# Patient Record
Sex: Male | Born: 1940 | Race: White | Hispanic: No | Marital: Married | Smoking: Former smoker
Health system: Southern US, Community
[De-identification: ages and names within clinical notes are randomized; demographics above are authoritative.]

## PROBLEM LIST (undated history)

## (undated) DIAGNOSIS — I700 Atherosclerosis of aorta: Secondary | ICD-10-CM

## (undated) DIAGNOSIS — I714 Abdominal aortic aneurysm, without rupture, unspecified: Secondary | ICD-10-CM

## (undated) DIAGNOSIS — I724 Aneurysm of artery of lower extremity: Secondary | ICD-10-CM

## (undated) DIAGNOSIS — E785 Hyperlipidemia, unspecified: Secondary | ICD-10-CM

## (undated) DIAGNOSIS — C801 Malignant (primary) neoplasm, unspecified: Secondary | ICD-10-CM

## (undated) DIAGNOSIS — J449 Chronic obstructive pulmonary disease, unspecified: Secondary | ICD-10-CM

## (undated) DIAGNOSIS — I2510 Atherosclerotic heart disease of native coronary artery without angina pectoris: Secondary | ICD-10-CM

## (undated) DIAGNOSIS — I10 Essential (primary) hypertension: Secondary | ICD-10-CM

## (undated) HISTORY — DX: Abdominal aortic aneurysm, without rupture, unspecified: I71.40

## (undated) HISTORY — DX: Hyperlipidemia, unspecified: E78.5

## (undated) HISTORY — DX: Abdominal aortic aneurysm, without rupture: I71.4

## (undated) HISTORY — PX: ABDOMINAL AORTIC ANEURYSM REPAIR: SUR1152

## (undated) HISTORY — DX: Essential (primary) hypertension: I10

## (undated) HISTORY — DX: Atherosclerosis of aorta: I70.0

## (undated) HISTORY — DX: Chronic obstructive pulmonary disease, unspecified: J44.9

## (undated) HISTORY — DX: Malignant (primary) neoplasm, unspecified: C80.1

## (undated) HISTORY — DX: Aneurysm of artery of lower extremity: I72.4

## (undated) HISTORY — PX: FEMORAL ARTERY ANEURYSM REPAIR: SUR1157

## (undated) HISTORY — DX: Atherosclerotic heart disease of native coronary artery without angina pectoris: I25.10

---

## 2008-01-21 ENCOUNTER — Ambulatory Visit: Payer: Self-pay | Admitting: Cardiology

## 2008-01-24 ENCOUNTER — Ambulatory Visit: Payer: Self-pay

## 2008-02-14 ENCOUNTER — Ambulatory Visit: Payer: Self-pay | Admitting: Cardiology

## 2008-02-19 ENCOUNTER — Ambulatory Visit: Payer: Self-pay | Admitting: Cardiology

## 2008-02-19 LAB — CONVERTED CEMR LAB
BUN: 21 mg/dL (ref 6–23)
Creatinine, Ser: 1.24 mg/dL (ref 0.40–1.50)
Glucose, Bld: 94 mg/dL (ref 70–99)
INR: 1.1 (ref 0.0–1.5)
Potassium: 4.5 meq/L (ref 3.5–5.3)
Prothrombin Time: 14.3 s (ref 11.6–15.2)

## 2008-02-21 ENCOUNTER — Ambulatory Visit: Payer: Self-pay | Admitting: Cardiology

## 2008-02-21 LAB — CONVERTED CEMR LAB
Basophils Relative: 1 % (ref 0–1)
Eosinophils Absolute: 0.4 10*3/uL (ref 0.0–0.7)
Lymphs Abs: 2.7 10*3/uL (ref 0.7–4.0)
MCHC: 34.2 g/dL (ref 30.0–36.0)
MCV: 91.3 fL (ref 78.0–100.0)
Neutro Abs: 3.8 10*3/uL (ref 1.7–7.7)
Neutrophils Relative %: 46 % (ref 43–77)
Platelets: 195 10*3/uL (ref 150–400)
WBC: 8.2 10*3/uL (ref 4.0–10.5)

## 2008-02-25 ENCOUNTER — Ambulatory Visit: Payer: Self-pay | Admitting: Internal Medicine

## 2008-02-25 ENCOUNTER — Inpatient Hospital Stay (HOSPITAL_BASED_OUTPATIENT_CLINIC_OR_DEPARTMENT_OTHER): Admission: RE | Admit: 2008-02-25 | Discharge: 2008-02-25 | Payer: Self-pay | Admitting: Internal Medicine

## 2008-03-12 ENCOUNTER — Ambulatory Visit: Payer: Self-pay | Admitting: Cardiology

## 2011-02-14 NOTE — Assessment & Plan Note (Signed)
[facility] OFFICE NOTE   NAME:Friddle, AKEEL REFFNER                       MRN:          [id number]  DATE:02/14/2008                            DOB:          [birth year]    CHIEF COMPLAINT:  Shortness of breath with exertion.   HISTORY OF PRESENT ILLNESS:  Mr. Kayo Zion is a very pleasant 70-year-  old retired white male who I saw initially on January 21, 2008 with the  above chief complaint.  He was a heavy smoker, two to three packs per  day, until he quit in April 2008.  He has a history of hypertension and  is overweight.   He had been profoundly short of breath.  Has gotten progressively worse,  particularly over the past year.  He has gotten to the point he can  barely play golf.   I arranged for him to have a stress Myoview to reproduce his symptoms  and to see what his exercise capacity was.   Unfortunately, he only exercised for 3 minutes reaching a met level of  only 4.8.  His heart rate jumped up from a baseline of about 90-100  beats per minute, sinus rhythm to 140 which is 90% of predicted maximum  heart rate.  There were nonspecific ST-segment changes with occasional  PVCs.  Stress Myoview showed a question of some inferior wall ischemia  which was a borderline call.  His EF was 68%.  He had normal  contractility thickening in all areas of myocardium.   He is now being set up for right and left heart cath to evaluate for  potential coronary disease as well as pulmonary hypertension.   PAST MEDICAL HISTORY:  1. He is currently on Terazosin 5 mg a day for blood pressure.  His      blood pressure log at home shows excellent control his blood      pressure.  2. He is also on aspirin 81 mg daily.   PAST SURGICAL HISTORY:  Prostate cancer surgery 1998 at [facility].   FAMILY HISTORY:  Unremarkable and noncontributory.   SOCIAL HISTORY:  He retired as of December 2003.  He is married and has  three  children.  His wife came to the office today and she is  delightful.   REVIEW OF SYSTEMS:  He has a history of seasonal allergies, chronic lung  disease, fatigue and history of prostate CA.  Otherwise, his review of  systems are negative.   STUDIES:  He had a chest x-ray recently that I am told was normal.  Actually, I just got a copy of it, and it shows moderate interstitial  density in the mid and lower lungs as well as with hyperinflation.  He  has findings consistent with COPD with fibrosis.  Date of the x-ray was  January 14, 2008.   PHYSICAL EXAMINATION:  VITAL SIGNS:  blood pressure was 132/90.  Pressures at home are much better.  His pulse is 87 and regular.  His  EKG shows sinus rhythm with a right bundle left anterior fascicular  block.  HEENT:  Normocephalic, atraumatic.  PERRL.  Extraocular movements  intact.  Sclerae are clear.  Face symmetry is normal.  Dentition  satisfactory.  He is 6 feet tall, weighs 245.  SKIN:  Warm and dry.  NECK:  Supple.  Carotid upstrokes equal bilaterally without bruits, no  JVD.  Thyroid is not enlarged.  Trachea is midline.  LUNGS:  Clear with decreased breath sounds throughout.  HEART:  Reveals a nondisplaced PMI.  There is an S4.  There is no  gallop.  S2 splits physiologically.  It was difficult to appreciate a  right ventricular lift.  ABDOMEN:  Slightly protuberant, good bowel sounds.  No midline bruit.  No hepatomegaly.  EXTREMITIES:  No cyanosis, clubbing or edema.  Pulses are intact.  NEUROLOGICAL:  Exam is intact.  SKIN:  Unremarkable.   ASSESSMENT:  Severe dyspnea on exertion with a very limited exercise  capacity.  We need to rule out potential coronary ischemia with a  borderline called inferior wall ischemia on a stress Myoview.  We also  need to evaluate his right-sided pressures to rule out secondary  pulmonary hypertension from his COPD.  I suspect this is the real  problem.  Also physical deconditioning and his weight is  an issue.  His  blood pressure is under excellent control.   RECOMMENDATIONS:  Outpatient catheterization right/left coronary  arteries.  Risks and potential benefits discussed with the patient.  He  has no dye allergy.  Will obtain precath labs.  He and his wife agree to  proceed.     Thomas C. Verl Blalock, MD, [facility]  Electronically Signed    TCW/MedQ  DD: 02/14/2008  DT: 02/14/2008  Job #: [id number]   cc:   Golden Pop, MD

## 2011-02-14 NOTE — Assessment & Plan Note (Signed)
[facility] OFFICE NOTE   NAME:Charles Barton                       MRN:          [id number]  DATE:01/21/2008                            DOB:          1941/09/08    REFERRING PHYSICIAN:  Golden Pop, MD   I was asked by Dr. Golden Pop to evaluate Charles Barton and consult on  his dyspnea on exertion.   HISTORY OF PRESENT ILLNESS:  Charles Barton is a very pleasant 70 year old  retired married white male who comes with his wife today for the above  complaint.  Last spring while playing golf, he noticed he was a little  more short of breath.  The same thing happened this fall.  This spring,  getting out in his yard has gotten a lot worse.  He also notes that when  he bends over to do mulch, that he gets lightheaded and dizzy.  He has  not fainted.  He denies any true angina.   He has had no orthopnea, PND or increased peripheral edema.   His cardiac risk factors are pertinent for age, sex, obesity, history of  remote tobacco of 43 years sometimes up as much as 2-3 packs per day  which he quit in April 2008, and hypertension.  His lipid profile is  profoundly good on no medication with a total cholesterol of 145, LDL of  65.  His HDL is low, and his triglycerides are up at 241.   His past medical history in addition to the above; he does not drink.  He does not use any recreational products.  He tries walk 1-2 miles 2-3  times a week.  He has no known drug allergies.   CURRENT MEDICATIONS:  1. Terazosin which he had been on for a number of years 5 mg daily.  2. Aspirin 81 mg daily.  3. Dr. Jeananne Rama tried to switch his Terazosin over to what sounds like      benazepril HCTZ, but the patient did not really want to do that.   PAST SURGICAL HISTORY:  He has had prostate cancer surgery in 1998 at  [facility]:  Unremarkable and noncontributory.   SOCIAL HISTORY:  He is retired as of December  2003.  He is married and  has 3 children.   REVIEW OF SYSTEMS:  He has history of seasonal allergies, chronic lung  disease, fatigue, and a history of cancer.  Otherwise, his review of  systems are negative.   He had a chest x-ray recently that was apparently okay.  In addition, he  has had blood work including a hemoglobin which was 16.2.  The rest of  his blood work was really unremarkable.   EXAM TODAY:  He is 6 feet tall, weighs 245.  Blood pressure is 146/90.  His pulse is 92 and regular.  HEENT:  Normocephalic, atraumatic.  PERRLA.  Extraocular is intact.  Sclerae are clear.  Facial symmetry is normal.  NECK:  Supple  Carotid upstrokes are equal bilaterally without bruits,  no JVD.  Thyroid is not  enlarged.  Trachea is midline.  LUNGS:  Clear.  HEART:  Reveals a nondisplaced PMI.  He has an S4.  There is no other  gallop.  S2 splits physiologically.  ABDOMEN:  Slightly protuberant with good bowel sounds.  No midline  bruit.  No hepatomegaly.  EXTREMITIES:  No cyanosis, clubbing, or edema.  Pulses are intact.  NEURO:  Intact.  SKIN:  Unremarkable.   EKG shows sinus rhythm, right bundle branch block, left anterior  fascicular block.  Apparently he has had some changes in the past.  I do  not have an old EKG.   ASSESSMENT:  Dyspnea on exertion.  As I explained to he and his wife  today, this is multifactorial, including the fact that he is overweight,  is relatively sedentary, has hypertension which is probably not well  controlled, and has a long history of smoking and certainly has some  chronic obstructive lung disease.  We need to rule out obstructive  coronary disease, as I explained him his wife at length today.   RECOMMENDATIONS:  1. Excise rest stress Myoview.  We will hopefully reproduce his      symptoms and see if he has any myocardial ischemia.  Also be able      to evaluate his left ventricular function.  2. Twenty-five pounds of weight loss over the next 6-8  months.  3. Consider changing his Terazosin to what Dr. Jeananne Rama had suggested.      I told him he is probably going to need at least a combination      medication or even three class of medicines to really control his      blood pressure.  Losing weight will also decrease this      substantially.  4. Increase activity once we clear his coronaries.   I will get him back to talk about all this once we get his stress test  results.     Thomas C. Verl Blalock, MD, [facility]  Electronically Signed    TCW/MedQ  DD: 01/21/2008  DT: 01/21/2008  Job #: [id number]   cc:   [address], [place], [place] Dr. Golden Pop, [facility]

## 2011-02-14 NOTE — Cardiovascular Report (Signed)
Charles Barton, Charles Barton                ACCOUNT NO.:  [id number]   MEDICAL RECORD NO.:  [id number]          PATIENT TYPE:  OIB   LOCATION:  1962                         FACILITY:  [facility]   PHYSICIAN:  Shaune Pascal. Bensimhon, MDDATE OF BIRTH:  November 21, 1940   DATE OF PROCEDURE:  02/25/2008  DATE OF DISCHARGE:  02/25/2008                            CARDIAC CATHETERIZATION   IDENTIFICATION:  Charles Barton is a very pleasant 70 year old former [employer] with a history of hypertension and tobacco use.  For the  past several months, he has had progressive dyspnea on exertion.  He has  not had any chest pain.  He saw Dr. Verl Blalock and got a stress Myoview which  showed poor exercise tolerance with brisk heart rate response.  There  were nonspecific ST changes and a question of some inferior ischemia and  EF of 68%.  He also had PFTs which is showed early COPD.  He is referred  to the outpatient lab for right and left heart catheterization.   PROCEDURES PERFORMED:  1. Right heart catheterization.  2. Selective coronary angiography.  3. Left heart catheterization.  4. Abdominal angiography.   DESCRIPTION OF PROCEDURE:  The risks and indication of procedure were  explained.  Consent was signed and placed in the chart.  A 7-French  venous sheath was placed in the right femoral vein using modified  Seldinger technique.  Standard Swan-Ganz catheter was used for right  heart catheterization.  A 4-French arterial sheath was placed in the  right femoral artery using a modified Seldinger technique.  Standard  catheters including a JL-4, 3D RCA, and angled pigtail were used.  All  catheter exchanges were made over the wire.  There were no apparent  complications.   HEMODYNAMIC RESULTS:  Right atrial pressure mean of 4, RV pressure 28/4,  PA pressure of 29/8 with a mean of 19.  Pulmonary capillary wedge  pressure mean of 5.  Central aortic pressure 115/80 with a mean of 98.  LV pressure 123/12.  No aortic  stenosis.   Left main was normal.   LAD was a long vessel coursing the apex, gave off a large proximal  diagonal.  There is a 20%-30% lesion in the proximal LAD spanning the  takeoff of the diagonal.   Left circumflex gave off an OM1 and OM2.  There is a 50%-60% lesion in  the proximal portion of a moderate-to-large OM2, this was not flow  limiting.   Right coronary artery was a large dominant vessel, gave off acute  marginal branch, a PDA, and 2 posterolaterals.  On the proximal portion  of the RCA, there was a prominent aneurysmal segment with some mild  plaquing but no high-grade stenosis.  There were tandem 30%-40% lesions  in the distal RCA right before the PDA takeoff.   Left ventriculogram done in the RAO projection showed ejection fraction  of 60%-65%.  There was evidence of new wall motion abnormalities.   Abdominal aortogram showed patent renal arteries bilaterally.  There was  20% stenosis in the left renal artery.  There was mild plaquing and  tortuosity of the distal abdominal aorta and iliacs.  No high-grade  lesions were seen.   ASSESSMENT:  1. Nonobstructive coronary artery disease as described above.  2. Normal left ventricular function.  3. Mild peripheral arterial disease.  4. Normal right heart pressures.   PLAN/DISCUSSION:  I suspect his symptoms are primarily due to his  laterally COPD and deconditioning.  He will need aggressive risk factor  management ahead of any progression of his coronary atherosclerosis  including a statin.  I have instructed him to engage in routine exercise  program.      Shaune Pascal. Bensimhon, MD  Electronically Signed     DRB/MEDQ  D:  02/25/2008  T:  02/26/2008  Job:  [id number]   cc:   Thomas C. Verl Blalock, MD, [facility]  Guadalupe Maple, M.D.

## 2011-02-14 NOTE — Assessment & Plan Note (Signed)
[facility] OFFICE NOTE   NAME:Nolde, KIMBERLEY DASTRUP                       MRN:          [id number]  DATE:03/12/2008                            DOB:          07/21/1941    HISTORY:  Mr. Empson returns today after having a right and left heart  catheterization.  Remarkably, his right-sided pressures were normal.  He  has nonobstructive coronary disease with a 20-30% LAD proximally, 50-60%  proximal lesion, and a moderate-to-large obtuse marginal 2 as well as a  30-40% distal right coronary artery.  His EF was 60-65% and there was no  segmental wall motion abnormalities or suggestion of previous  infarction.  His abdominal aortogram showed patent renal arteries  bilaterally and 20% stenosis in the left renal.  He has some mild plaque  in his distal aorta and iliacs.   I have had a long talk with Mr. Cure today.  I have explained all his  findings using a heart model and blood vessel models.  I have reviewed  the importance of blood pressure control, weight reduction, and gradual  increase in his exercise for improving deconditioning.  There is no  doubt he has some chronic obstructive lung disease, which will not get  better.  This may require specific pulmonary treatment down the road,  which I will leave to Dr. Rance Muir expertise.   In addition, he is taking an aspirin a day, which I told him to  continue.  This will decrease his risk of an acute coronary syndrome.  He is on terazosin with borderline blood pressure control and I have  suggested to him that he consider amlodipine with Dr. Jeananne Rama down the  road.   In regard to his lipids, his LDL is remarkably good on no meds at 65.  His total cholesterol is 145.  He could do better with his triglycerides  of 241 with diet most likely.  His HDL is 32.  He is certainly not  smoking and getting his weight down should help this.  I am not sure he  would benefit that  much on a statin other than perhaps plaque  stabilization.   All his questions were answered.  In summary, I would recommend the  following:  1. Physical conditioning with a gradual increase in exercise.  2. Weight reduction.  3. Optimal blood pressure control.  He may need amlodipine added to      his regimen.  4. No strong indication for a statin at this time.  I would repeat his      lipids down the road and make sure his HDL is above 40 and his      triglycerides are normalized.  If that is what the case, then a low-      dose statin maybe indicated.  I will see him back in a year.     Thomas C. Verl Blalock, MD, [facility]  Electronically Signed    TCW/MedQ  DD: 03/12/2008  DT: 03/13/2008  Job #: [id number]   cc:   Guadalupe Maple, MD

## 2011-06-28 LAB — CBC
Hemoglobin: 17.3 — ABNORMAL HIGH
RBC: 5.35
WBC: 8.5

## 2011-06-28 LAB — POCT I-STAT 3, VENOUS BLOOD GAS (G3P V)
Acid-base deficit: 1
Bicarbonate: 23.7
Bicarbonate: 24.1 — ABNORMAL HIGH
O2 Saturation: 60
O2 Saturation: 62
TCO2: 25
TCO2: 25
pCO2, Ven: 42.5 — ABNORMAL LOW
pO2, Ven: 33
pO2, Ven: 34

## 2011-06-28 LAB — POCT I-STAT 3, ART BLOOD GAS (G3+)
Bicarbonate: 22.3
Operator id: 194801
pH, Arterial: 7.403
pO2, Arterial: 62 — ABNORMAL LOW

## 2012-10-02 HISTORY — PX: ABDOMINAL AORTIC ANEURYSM REPAIR: SUR1152

## 2012-10-02 HISTORY — PX: FEMORAL ARTERY ANEURYSM REPAIR: SUR1157

## 2013-09-02 ENCOUNTER — Inpatient Hospital Stay: Payer: Self-pay | Admitting: Internal Medicine

## 2013-09-02 ENCOUNTER — Ambulatory Visit: Payer: Self-pay | Admitting: Family Medicine

## 2013-09-02 LAB — CK TOTAL AND CKMB (NOT AT ARMC)
CK, Total: 94 U/L (ref 35–232)
CK-MB: 1.9 ng/mL (ref 0.5–3.6)

## 2013-09-02 LAB — APTT: Activated PTT: 39.4 secs — ABNORMAL HIGH (ref 23.6–35.9)

## 2013-09-02 LAB — COMPREHENSIVE METABOLIC PANEL
Albumin: 3.8 g/dL (ref 3.4–5.0)
Alkaline Phosphatase: 91 U/L
Anion Gap: 4 — ABNORMAL LOW (ref 7–16)
BUN: 28 mg/dL — ABNORMAL HIGH (ref 7–18)
Calcium, Total: 9 mg/dL (ref 8.5–10.1)
Co2: 25 mmol/L (ref 21–32)
SGPT (ALT): 24 U/L (ref 12–78)
Total Protein: 8 g/dL (ref 6.4–8.2)

## 2013-09-02 LAB — CBC
HGB: 18.9 g/dL — ABNORMAL HIGH (ref 13.0–18.0)
MCH: 30.8 pg (ref 26.0–34.0)
MCHC: 32.7 g/dL (ref 32.0–36.0)
MCV: 94 fL (ref 80–100)
Platelet: 179 10*3/uL (ref 150–440)
RBC: 6.12 10*6/uL — ABNORMAL HIGH (ref 4.40–5.90)

## 2013-09-02 LAB — PROTIME-INR: INR: 1.2

## 2013-09-03 DIAGNOSIS — E785 Hyperlipidemia, unspecified: Secondary | ICD-10-CM

## 2013-09-03 DIAGNOSIS — J449 Chronic obstructive pulmonary disease, unspecified: Secondary | ICD-10-CM

## 2013-09-03 DIAGNOSIS — Z01810 Encounter for preprocedural cardiovascular examination: Secondary | ICD-10-CM

## 2013-09-03 DIAGNOSIS — I059 Rheumatic mitral valve disease, unspecified: Secondary | ICD-10-CM

## 2013-09-03 LAB — BASIC METABOLIC PANEL
Calcium, Total: 9.1 mg/dL (ref 8.5–10.1)
Co2: 22 mmol/L (ref 21–32)
Creatinine: 1.11 mg/dL (ref 0.60–1.30)
EGFR (African American): >60
EGFR (Non-African Amer.): >60
Glucose: 151 mg/dL — ABNORMAL HIGH (ref 65–99)
Osmolality: 274 (ref 275–301)

## 2013-09-03 LAB — CBC WITH DIFFERENTIAL/PLATELET
Basophil #: 0.1 10*3/uL (ref 0.0–0.1)
Eosinophil #: 0 10*3/uL (ref 0.0–0.7)
HCT: 54 % — ABNORMAL HIGH (ref 40.0–52.0)
HGB: 18.5 g/dL — ABNORMAL HIGH (ref 13.0–18.0)
MCHC: 34.2 g/dL (ref 32.0–36.0)
MCV: 93 fL (ref 80–100)
Monocyte #: 0.1 x10 3/mm — ABNORMAL LOW (ref 0.2–1.0)
Neutrophil %: 91.2 %
RBC: 5.81 10*6/uL (ref 4.40–5.90)
RDW: 14.8 % — ABNORMAL HIGH (ref 11.5–14.5)

## 2013-09-03 LAB — TROPONIN I: Troponin-I: <0.02 ng/mL

## 2013-09-04 LAB — PLATELET COUNT: Platelet: 177 10*3/uL (ref 150–440)

## 2013-09-05 ENCOUNTER — Encounter: Payer: Self-pay | Admitting: Cardiovascular Disease

## 2013-09-05 LAB — PROTIME-INR
INR: 1.1
Prothrombin Time: 14.5 secs (ref 11.5–14.7)

## 2013-09-08 LAB — CBC WITH DIFFERENTIAL/PLATELET
Basophil #: 0 10*3/uL (ref 0.0–0.1)
Basophil %: 0.3 %
Eosinophil #: 0.1 10*3/uL (ref 0.0–0.7)
Eosinophil %: 0.6 %
HCT: 50.7 % (ref 40.0–52.0)
HGB: 16.9 g/dL (ref 13.0–18.0)
Lymphocyte #: 1.6 10*3/uL (ref 1.0–3.6)
Lymphocyte %: 11.6 %
MCHC: 33.4 g/dL (ref 32.0–36.0)
MCV: 94 fL (ref 80–100)
Monocyte #: 1.8 x10 3/mm — ABNORMAL HIGH (ref 0.2–1.0)
Neutrophil #: 10.4 10*3/uL — ABNORMAL HIGH (ref 1.4–6.5)
Neutrophil %: 74.4 %
RBC: 5.37 10*6/uL (ref 4.40–5.90)
RDW: 14.8 % — ABNORMAL HIGH (ref 11.5–14.5)
WBC: 14 10*3/uL — ABNORMAL HIGH (ref 3.8–10.6)

## 2013-09-08 LAB — BASIC METABOLIC PANEL
Anion Gap: 8 (ref 7–16)
BUN: 20 mg/dL — ABNORMAL HIGH (ref 7–18)
Calcium, Total: 8.8 mg/dL (ref 8.5–10.1)
Co2: 25 mmol/L (ref 21–32)
Creatinine: 0.9 mg/dL (ref 0.60–1.30)
EGFR (Non-African Amer.): >60
Sodium: 136 mmol/L (ref 136–145)

## 2013-09-09 LAB — CBC WITH DIFFERENTIAL/PLATELET
Eosinophil #: 0.3 10*3/uL (ref 0.0–0.7)
HGB: 14.7 g/dL (ref 13.0–18.0)
Lymphocyte #: 1.3 10*3/uL (ref 1.0–3.6)
Lymphocyte %: 10.1 %
MCHC: 33.5 g/dL (ref 32.0–36.0)
MCV: 94 fL (ref 80–100)
Neutrophil #: 9.3 10*3/uL — ABNORMAL HIGH (ref 1.4–6.5)
Neutrophil %: 74.6 %
Platelet: 103 10*3/uL — ABNORMAL LOW (ref 150–440)
RBC: 4.68 10*6/uL (ref 4.40–5.90)
RDW: 14.5 % (ref 11.5–14.5)

## 2013-09-09 LAB — COMPREHENSIVE METABOLIC PANEL
Bilirubin,Total: 0.9 mg/dL (ref 0.2–1.0)
Creatinine: 0.96 mg/dL (ref 0.60–1.30)
EGFR (African American): >60
Glucose: 107 mg/dL — ABNORMAL HIGH (ref 65–99)
Osmolality: 274 (ref 275–301)
Potassium: 3.8 mmol/L (ref 3.5–5.1)
Sodium: 136 mmol/L (ref 136–145)

## 2013-09-09 LAB — APTT: Activated PTT: 29.8 secs (ref 23.6–35.9)

## 2013-09-09 LAB — PHOSPHORUS: Phosphorus: 2.9 mg/dL (ref 2.5–4.9)

## 2013-09-09 LAB — PROTIME-INR: INR: 1.1

## 2014-02-21 ENCOUNTER — Inpatient Hospital Stay: Payer: Self-pay | Admitting: Internal Medicine

## 2014-02-21 LAB — CBC WITH DIFFERENTIAL/PLATELET
BASOS PCT: 0.5 %
Basophil #: 0.1 10*3/uL (ref 0.0–0.1)
Comment - H1-Com2: NORMAL
Eosinophil #: 0.1 10*3/uL (ref 0.0–0.7)
Eosinophil %: 0.6 %
HCT: 52.9 % — ABNORMAL HIGH (ref 40.0–52.0)
HGB: 17.4 g/dL (ref 13.0–18.0)
LYMPHS PCT: 8.2 %
Lymphocyte #: 1.4 10*3/uL (ref 1.0–3.6)
MCH: 31 pg (ref 26.0–34.0)
MCHC: 32.9 g/dL (ref 32.0–36.0)
MCV: 94 fL (ref 80–100)
MONO ABS: 2.1 x10 3/mm — AB (ref 0.2–1.0)
Monocyte %: 12.8 %
NEUTROS ABS: 13.1 10*3/uL — AB (ref 1.4–6.5)
NEUTROS PCT: 77.9 %
Platelet: 230 10*3/uL (ref 150–440)
RBC: 5.62 10*6/uL (ref 4.40–5.90)
RDW: 15 % — ABNORMAL HIGH (ref 11.5–14.5)
WBC: 16.8 10*3/uL — ABNORMAL HIGH (ref 3.8–10.6)

## 2014-02-21 LAB — URINALYSIS, COMPLETE
BACTERIA: NONE SEEN
Bilirubin,UR: NEGATIVE
Blood: NEGATIVE
Glucose,UR: NEGATIVE mg/dL (ref 0–75)
Leukocyte Esterase: NEGATIVE
NITRITE: NEGATIVE
Ph: 6 (ref 4.5–8.0)
Protein: 30
RBC,UR: 2 /HPF (ref 0–5)
Specific Gravity: 1.01 (ref 1.003–1.030)
Squamous Epithelial: 8

## 2014-02-21 LAB — BASIC METABOLIC PANEL
Anion Gap: 6 — ABNORMAL LOW (ref 7–16)
BUN: 24 mg/dL — AB (ref 7–18)
CALCIUM: 9.4 mg/dL (ref 8.5–10.1)
CHLORIDE: 103 mmol/L (ref 98–107)
CO2: 27 mmol/L (ref 21–32)
Creatinine: 1.27 mg/dL (ref 0.60–1.30)
GFR CALC NON AF AMER: 56 — AB
Glucose: 97 mg/dL (ref 65–99)
Osmolality: 276 (ref 275–301)
POTASSIUM: 4.3 mmol/L (ref 3.5–5.1)
SODIUM: 136 mmol/L (ref 136–145)

## 2014-02-21 LAB — TROPONIN I: TROPONIN-I: 0.04 ng/mL

## 2014-02-25 LAB — CBC WITH DIFFERENTIAL/PLATELET
BASOS ABS: 0 10*3/uL (ref 0.0–0.1)
BASOS PCT: 0.1 %
Eosinophil #: 0 10*3/uL (ref 0.0–0.7)
Eosinophil %: 0.1 %
HCT: 49 % (ref 40.0–52.0)
HGB: 16.3 g/dL (ref 13.0–18.0)
LYMPHS PCT: 9.4 %
Lymphocyte #: 1.3 10*3/uL (ref 1.0–3.6)
MCH: 31.2 pg (ref 26.0–34.0)
MCHC: 33.2 g/dL (ref 32.0–36.0)
MCV: 94 fL (ref 80–100)
Monocyte #: 1.3 x10 3/mm — ABNORMAL HIGH (ref 0.2–1.0)
Monocyte %: 9.1 %
Neutrophil #: 11.4 10*3/uL — ABNORMAL HIGH (ref 1.4–6.5)
Neutrophil %: 81.3 %
Platelet: 247 10*3/uL (ref 150–440)
RBC: 5.22 10*6/uL (ref 4.40–5.90)
RDW: 15.1 % — ABNORMAL HIGH (ref 11.5–14.5)
WBC: 14 10*3/uL — AB (ref 3.8–10.6)

## 2014-02-25 LAB — BASIC METABOLIC PANEL
ANION GAP: 7 (ref 7–16)
BUN: 23 mg/dL — ABNORMAL HIGH (ref 7–18)
CHLORIDE: 104 mmol/L (ref 98–107)
CREATININE: 0.93 mg/dL (ref 0.60–1.30)
Calcium, Total: 9.1 mg/dL (ref 8.5–10.1)
Co2: 25 mmol/L (ref 21–32)
EGFR (African American): >60
EGFR (Non-African Amer.): >60
Glucose: 111 mg/dL — ABNORMAL HIGH (ref 65–99)
Osmolality: 276 (ref 275–301)
Potassium: 4.3 mmol/L (ref 3.5–5.1)
Sodium: 136 mmol/L (ref 136–145)

## 2014-02-25 LAB — MAGNESIUM: Magnesium: 2 mg/dL

## 2014-02-26 LAB — CBC WITH DIFFERENTIAL/PLATELET
Basophil #: 0 10*3/uL (ref 0.0–0.1)
Basophil %: 0.2 %
EOS ABS: 0 10*3/uL (ref 0.0–0.7)
Eosinophil %: 0.2 %
HCT: 53.9 % — ABNORMAL HIGH (ref 40.0–52.0)
HGB: 17.9 g/dL (ref 13.0–18.0)
Lymphocyte #: 1.3 10*3/uL (ref 1.0–3.6)
Lymphocyte %: 9.3 %
MCH: 31.4 pg (ref 26.0–34.0)
MCHC: 33.1 g/dL (ref 32.0–36.0)
MCV: 95 fL (ref 80–100)
Monocyte #: 1.5 x10 3/mm — ABNORMAL HIGH (ref 0.2–1.0)
Monocyte %: 11.4 %
NEUTROS ABS: 10.7 10*3/uL — AB (ref 1.4–6.5)
NEUTROS PCT: 78.9 %
PLATELETS: 289 10*3/uL (ref 150–440)
RBC: 5.69 10*6/uL (ref 4.40–5.90)
RDW: 15.3 % — ABNORMAL HIGH (ref 11.5–14.5)
WBC: 13.6 10*3/uL — AB (ref 3.8–10.6)

## 2014-02-27 LAB — CBC WITH DIFFERENTIAL/PLATELET
Basophil #: 0 10*3/uL (ref 0.0–0.1)
Basophil %: 0.2 %
EOS ABS: 0.1 10*3/uL (ref 0.0–0.7)
Eosinophil %: 1.1 %
HCT: 54.3 % — AB (ref 40.0–52.0)
HGB: 17.8 g/dL (ref 13.0–18.0)
LYMPHS PCT: 16.3 %
Lymphocyte #: 2 10*3/uL (ref 1.0–3.6)
MCH: 31 pg (ref 26.0–34.0)
MCHC: 32.8 g/dL (ref 32.0–36.0)
MCV: 95 fL (ref 80–100)
MONO ABS: 1.5 x10 3/mm — AB (ref 0.2–1.0)
MONOS PCT: 11.7 %
NEUTROS ABS: 8.8 10*3/uL — AB (ref 1.4–6.5)
Neutrophil %: 70.7 %
Platelet: 290 10*3/uL (ref 150–440)
RBC: 5.75 10*6/uL (ref 4.40–5.90)
RDW: 15.2 % — ABNORMAL HIGH (ref 11.5–14.5)
WBC: 12.5 10*3/uL — ABNORMAL HIGH (ref 3.8–10.6)

## 2014-03-09 ENCOUNTER — Ambulatory Visit: Payer: Self-pay | Admitting: Internal Medicine

## 2014-05-13 ENCOUNTER — Ambulatory Visit: Payer: Self-pay | Admitting: Internal Medicine

## 2014-09-15 ENCOUNTER — Ambulatory Visit: Payer: Self-pay | Admitting: Internal Medicine

## 2014-09-30 ENCOUNTER — Ambulatory Visit: Payer: Self-pay | Admitting: Oncology

## 2014-09-30 LAB — CBC CANCER CENTER
Basophil #: 0.1 x10 3/mm (ref 0.0–0.1)
Basophil %: 0.6 %
EOS ABS: 0.2 x10 3/mm (ref 0.0–0.7)
Eosinophil %: 1.9 %
HCT: 60.2 % — ABNORMAL HIGH (ref 40.0–52.0)
HGB: 20 g/dL — AB (ref 13.0–18.0)
LYMPHS ABS: 1 x10 3/mm (ref 1.0–3.6)
Lymphocyte %: 10.7 %
MCH: 31.9 pg (ref 26.0–34.0)
MCHC: 33.2 g/dL (ref 32.0–36.0)
MCV: 96 fL (ref 80–100)
Monocyte #: 0.7 x10 3/mm (ref 0.2–1.0)
Monocyte %: 7.8 %
NEUTROS PCT: 79 %
Neutrophil #: 7.4 x10 3/mm — ABNORMAL HIGH (ref 1.4–6.5)
Platelet: 159 x10 3/mm (ref 150–440)
RBC: 6.28 10*6/uL — ABNORMAL HIGH (ref 4.40–5.90)
RDW: 15 % — ABNORMAL HIGH (ref 11.5–14.5)
WBC: 9.4 x10 3/mm (ref 3.8–10.6)

## 2014-09-30 LAB — FERRITIN: Ferritin (ARMC): 330 ng/mL (ref 8–388)

## 2014-09-30 LAB — IRON AND TIBC
IRON BIND. CAP.(TOTAL): 292 ug/dL (ref 250–450)
Iron Saturation: 42 %
Iron: 124 ug/dL (ref 65–175)
UNBOUND IRON-BIND. CAP.: 168 ug/dL

## 2014-10-02 ENCOUNTER — Ambulatory Visit: Payer: Self-pay | Admitting: Oncology

## 2014-10-07 LAB — CANCER CENTER HEMOGLOBIN: HGB: 18.6 g/dL — AB (ref 13.0–18.0)

## 2014-10-21 LAB — CBC CANCER CENTER
BASOS ABS: 0.3 x10 3/mm — AB (ref 0.0–0.1)
Basophil %: 3.5 %
EOS PCT: 2 %
Eosinophil #: 0.2 x10 3/mm (ref 0.0–0.7)
HCT: 56.3 % — AB (ref 40.0–52.0)
HGB: 18.7 g/dL — ABNORMAL HIGH (ref 13.0–18.0)
LYMPHS ABS: 1.9 x10 3/mm (ref 1.0–3.6)
Lymphocyte %: 24 %
MCH: 31.4 pg (ref 26.0–34.0)
MCHC: 33.2 g/dL (ref 32.0–36.0)
MCV: 95 fL (ref 80–100)
MONO ABS: 0.9 x10 3/mm (ref 0.2–1.0)
MONOS PCT: 11.6 %
NEUTROS ABS: 4.6 x10 3/mm (ref 1.4–6.5)
Neutrophil %: 58.9 %
Platelet: 134 x10 3/mm — ABNORMAL LOW (ref 150–440)
RBC: 5.93 10*6/uL — ABNORMAL HIGH (ref 4.40–5.90)
RDW: 15.5 % — ABNORMAL HIGH (ref 11.5–14.5)
WBC: 7.8 x10 3/mm (ref 3.8–10.6)

## 2014-10-21 LAB — BASIC METABOLIC PANEL
ANION GAP: 8 (ref 7–16)
BUN: 15 mg/dL (ref 7–18)
CHLORIDE: 106 mmol/L (ref 98–107)
CO2: 30 mmol/L (ref 21–32)
Calcium, Total: 8.7 mg/dL (ref 8.5–10.1)
Creatinine: 1.37 mg/dL — ABNORMAL HIGH (ref 0.60–1.30)
EGFR (African American): >60
EGFR (Non-African Amer.): 54 — ABNORMAL LOW
Glucose: 71 mg/dL (ref 65–99)
Osmolality: 286 (ref 275–301)
Potassium: 4 mmol/L (ref 3.5–5.1)
Sodium: 144 mmol/L (ref 136–145)

## 2014-11-02 ENCOUNTER — Ambulatory Visit: Payer: Self-pay | Admitting: Oncology

## 2014-11-06 LAB — CBC CANCER CENTER
Basophil #: 0.1 x10 3/mm (ref 0.0–0.1)
Basophil %: 0.9 %
EOS ABS: 0.3 x10 3/mm (ref 0.0–0.7)
EOS PCT: 3.3 %
HCT: 55.1 % — ABNORMAL HIGH (ref 40.0–52.0)
HGB: 18.3 g/dL — ABNORMAL HIGH (ref 13.0–18.0)
LYMPHS ABS: 1.7 x10 3/mm (ref 1.0–3.6)
Lymphocyte %: 19.8 %
MCH: 31.6 pg (ref 26.0–34.0)
MCHC: 33.3 g/dL (ref 32.0–36.0)
MCV: 95 fL (ref 80–100)
MONO ABS: 1.2 x10 3/mm — AB (ref 0.2–1.0)
Monocyte %: 13 %
NEUTROS PCT: 63 %
Neutrophil #: 5.6 x10 3/mm (ref 1.4–6.5)
PLATELETS: 171 x10 3/mm (ref 150–440)
RBC: 5.81 10*6/uL (ref 4.40–5.90)
RDW: 16.1 % — ABNORMAL HIGH (ref 11.5–14.5)
WBC: 8.8 x10 3/mm (ref 3.8–10.6)

## 2014-11-17 ENCOUNTER — Ambulatory Visit: Payer: Self-pay | Admitting: Oncology

## 2014-12-01 ENCOUNTER — Ambulatory Visit
Admit: 2014-12-01 | Disposition: A | Discharge status: Home / Self Care | Payer: Self-pay | Attending: Oncology | Admitting: Oncology

## 2015-01-01 ENCOUNTER — Ambulatory Visit
Admit: 2015-01-01 | Disposition: A | Discharge status: Home / Self Care | Payer: Self-pay | Attending: Oncology | Admitting: Oncology

## 2015-01-15 LAB — CBC CANCER CENTER
BASOS ABS: 0.1 x10 3/mm (ref 0.0–0.1)
Basophil %: 1.2 %
EOS PCT: 3.1 %
Eosinophil #: 0.3 x10 3/mm (ref 0.0–0.7)
HCT: 52.4 % — ABNORMAL HIGH (ref 40.0–52.0)
HGB: 17.2 g/dL (ref 13.0–18.0)
Lymphocyte #: 2.1 x10 3/mm (ref 1.0–3.6)
Lymphocyte %: 22.3 %
MCH: 30.2 pg (ref 26.0–34.0)
MCHC: 32.9 g/dL (ref 32.0–36.0)
MCV: 92 fL (ref 80–100)
MONOS PCT: 9.4 %
Monocyte #: 0.9 x10 3/mm (ref 0.2–1.0)
NEUTROS PCT: 64 %
Neutrophil #: 5.9 x10 3/mm (ref 1.4–6.5)
PLATELETS: 182 x10 3/mm (ref 150–440)
RBC: 5.71 10*6/uL (ref 4.40–5.90)
RDW: 14.8 % — AB (ref 11.5–14.5)
WBC: 9.3 x10 3/mm (ref 3.8–10.6)

## 2015-01-22 NOTE — Consult Note (Signed)
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 Cardiology consulted for preop eval for large AAA and iliac aneurysm.  He went to see his PMD, Dr. Jeananne Rama. For his leg numbness (progressive), he was sent to the ER. He reports baseline severe SOB, limiting of exertion for SOB, fatigue. Worse recently with leg numbness.He has some cough with no productive sputum.  At his PMD, he was initially found to have oxygen saturations of 79%. x-rays were obtained which showed a 7 cm abdominal aortic aneurysm. CAD seen non CT scan.   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   he denies having any chest pain, just SOB and fatigue.  Prior to this admission, the patient was active, but "paced himself", walked slowly.   Present Illness . SOCIAL HISTORY:  Smoked heavily in the past, quit 4 to 5 years back. Prior to that smoked 2 packs a day for 40 years. Denies drinking alcohol or using illicit drugs. Retired.  FAMILY HISTORY:  History of COPD, father died in his 81s on the toilet, mother lived into her 84s.   Physical Exam:  GEN well developed, well nourished, no acute distress   HEENT hearing intact to voice, moist oral mucosa   NECK supple   RESP normal resp effort  wheezing   CARD Regular rate and rhythm  No murmur   ABD denies tenderness  soft     Hypercholesterolemia:    copd:    htn:   Home Medications: Medication Instructions Status  amLODIPine 5 mg oral tablet 1 tab(s) orally once a day Active  atenolol 50 mg oral tablet 1 tab(s) orally once a day Active  Symbicort 80  mcg-4.5 mcg/inh inhalation aerosol 2 puff(s) inhaled 2 times a day Active  albuterol CFC free 90 mcg/inh inhalation aerosol 2 puff(s) inhaled every 6 hours, As Needed - for Shortness of Breath Active  Flonase 50 mcg/inh nasal spray 2 spray(s) nasal once a day Active  Spiriva 18 mcg inhalation capsule 1 cap(s) inhaled once a day Active  Aspirin Enteric Coated 81 mg oral delayed release tablet 1 tab(s) orally every other day Active  atorvastatin 20 mg oral tablet 1 tab(s) orally once a day (at bedtime) Active   Lab Results:  Lab:  02-Dec-14 17:35   pH (ABG)  7.37  PCO2 35  PO2  61  FiO2 36  Base Excess  -4.4  HCO3  20.2  O2 Saturation 93.7  O2 Device CANNULA  Specimen Site (ABG) RT BRACHIAL  Specimen Type (ABG) ARTERIAL  Patient Temp (ABG) 37.0 (Result(s) reported on 02 Sep 2013 at 05:39PM.)  Lactic Acid, Cardiopulmonary 0.8 (Result(s) reported on 02 Sep 2013 at 05:39PM.)  Routine Chem:  03-Dec-14 04:37   Glucose, Serum  151  BUN  21  Creatinine (comp) 1.11  Sodium, Serum  134  Potassium, Serum 4.3  Chloride, Serum 103  CO2, Serum 22  Calcium (Total), Serum 9.1  Anion Gap 9  Osmolality (calc) 274  eGFR (African American) >60  eGFR (Non-African American) >60 (eGFR values <75m/min/1.73 m2 may be an indication of chronic kidney  disease (CKD). Calculated eGFR is useful in patients with stable renal function. The eGFR calculation will not be reliable in acutely ill patients when serum creatinine is changing rapidly. It is not useful in  patients on dialysis. The eGFR calculation may not be applicable to patients at the low and high extremes of body sizes, pregnant women, and vegetarians.)  Routine Coag:  03-Dec-14 04:37   Activated PTT (APTT)  100.6 (A HCT value >55% may artifactually increase the APTT. In one study, the increase was an average of 19%. Reference: "Effect on Routine and Special Coagulation Testing Values of Citrate Anticoagulant Adjustment in Patients with  High HCT Values." American Journal of Clinical Pathology 2006;126:400-405.)  Routine Hem:  02-Dec-14 16:59   Hematocrit (CBC)  57.6  03-Dec-14 04:37   WBC (CBC) 7.6  RBC (CBC) 5.81  Hemoglobin (CBC)  18.5  Hematocrit (CBC)  54.0  Platelet Count (CBC) 168  MCV 93  MCH 31.8  MCHC 34.2  RDW  14.8  Neutrophil % 91.2  Lymphocyte % 6.5  Monocyte % 1.3  Eosinophil % 0.0  Basophil % 1.0  Neutrophil #  6.9  Lymphocyte #  0.5  Monocyte #  0.1  Eosinophil # 0.0  Basophil # 0.1 (Result(s) reported on 03 Sep 2013 at 05:47AM.)   EKG:  Interpretation EKG shows NSR with RBBB, LAFB, poor R wave progression through the anterior precordial leads, unable to exclude infarct   Radiology Results: CT:    02-Dec-14 19:30, CT Angiography Chest/Abd/Pelvis w/wo  CT Angiography Chest/Abd/Pelvis w/wo   REASON FOR EXAM:    hypoxia recently diagnosed AAA and femoral aneurysms   eval dissection  COMMENTS:       PROCEDURE: CT  - CT ANGIOGRAPHY CHEST/ABD/PELVIS  - Sep 02 2013  7:30PM     CLINICAL DATA:  Hypoxia. Evidence of abdominal aortic and right  common iliac arterial aneurysms by lumbar spine x-rays.    EXAM:  CT ANGIOGRAPHY CHEST, ABDOMEN AND PELVIS    TECHNIQUE:  Multidetector CT imaging through the chest, abdomen and pelvis was  performed using the standard protocol during bolus administration of  intravenous contrast. Multiplanar reconstructed images including  MIPs were obtained and reviewed to evaluate the vascular anatomy.    CONTRAST:  125 mL Isovue 370 IV    COMPARISON:  Lumbar spine films earlier today.    FINDINGS:  CTA CHEST FINDINGS    There is no evidence of thoracic aortic aneurysmal disease or  dissection. Maximal caliber of the aorta is 3.7 cm at the level of  the ascending thoracic aorta. Proximal great vessels show mild  calcified plaque at the origin of the left subclavian artery. No  significant stenosis is identified involving the visualized great  vessels.  Both calcified and noncalcified plaque is noted along the  undersurface of the distal aortic arch. Some of the noncalcified  plaque appears ulcerated and extends up into the lumen of the aorta.    Coronary artery disease is evident with calcified plaque identified  in the distribution of the LAD and right coronary artery. The heart  size is within normal limits. Lungs show evidence of severe  emphysematous lung disease with large emphysematous bleb present in  both lungs and predominantly in the upper lung zones. No  pneumothorax, pulmonary mass or enlarged lymph node is seen. There  are some fibrotic changes in both lower lungs with peripheral  honeycombing present. Associated prominence of central pulmonary  arteries likely reflects underlying pulmonary arterial hypertension.  Mild degenerative changes are seen in the thoracic spine.  Review of the MIP images confirms the above findings.    CTA ABDOMEN AND PELVIS FINDINGS    The upper abdominal aorta shows normal caliber. Origins of the  celiac axis and superior mesenteric artery are normally patent.  Bilateral single renal arteries show mild proximal calcified plaque  without significant stenosis. The inferior mesenteric artery is  open. Aneurysmal dilatation of the distal aorta begins approximately  5 cm below the left renal artery origin. The infrarenal neck  measures approximately 3 cm in maximal diameter. The aneurysm of the  distal aorta measures 6.5 cm in greatest diameter well above the  aortic bifurcation. There is capacious dilatation at the level of  the bifurcation where maximal width measures 7 cm. Prominent  thrombus is present in the distal aspect of the aortic aneurysm.  There is no evidence of ruptured aneurysm, dissection or  inflammatory aneurysm.    Aneurysmal disease of the right common iliac artery is present  beginning at its origin and extending to the common iliac  bifurcation. Maximal diameter of the  common iliac aneurysm is 5.9 cm  with much of the lumen of this aneurysm containing thrombus.  Aneurysmal disease at the origin of the internal iliac artery on the  right measures 2.7 cm in diameter. Distal branches of the right  internal iliac artery are open but show attenuation of anterior  division branches. The external iliac artery is tortuous but shows  no aneurysmal disease or significant occlusive disease. The right  common femoral artery demonstrates focal aneurysmal dilatation just  below the inguinal ligament measuring 1.6 cm in diameter. The  femoral bifurcation is normally patent.  Fusiform aneurysmal disease of the left common iliac artery measures  2.3 cm in diameter. Mild dilatation of the internal iliac trunk  present measuring 12 mm. The external iliac artery is patent and  tortuous. The common femoral artery shows irregular plaque causing  maximal focal 40- 50% stenosis. No significant aneurysmal disease of  the left common femoral artery is present.    There is evidenceof prior prostatectomy and retroperitoneal lymph  node dissection with multiple surgical clips present at the level of  the prostate head and extending up into the pelvis along iliac nodal  chain distributions bilaterally. No enlarged lymph nodes areseen.  The bladder is unremarkable. There are some calcified gallstones in  a nondistended gallbladder. Other solid organs appear unremarkable.  No abnormalities are seen involving bowel. Small left inguinal  hernia contains fat. Moderate spondylosispresent at L3-4, L4-5 and  L5-S1. There is associated mild retrolisthesis of L4 on L5 and  anterolisthesis of L5 on S1. Bilateral pars defects are suspected at  the L5 level.    Review of the MIP images confirms the above findings.     IMPRESSION:  1. No evidence of thoracic aortic aneurysm. Noncalcified  atheromatous ulcerative plaque extends into the lumen of the aortic  arch at the level of the distal  arch. This could pose a future  embolic risk.  2. Coronary atherosclerosis with calcified plaque noted in the  distribution of the LAD and right coronary artery.  3. 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  4. Prominent distal abdominal aortic aneurysm measuring up to 7 cm  in greatest diameter at the level of the aortic bifurcation and 6.5  cm in the main body of the fusiform aneurysm. The aneurysm begins  well below the level of  the renal arteries. No associated dissection  or evidence of aneurysm rupture.  5. Prominent aneurysmal disease of the right common iliac artery  measuring 5.9 cm. There also is a 2.7 cm proximal aneurysm of the  internal iliac artery on the right. Aneurysmal dilatation of the  right common femoral artery measures 1.6 cm.  6. 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      Electronically Signed    By: Aletta Edouard M.D.    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 Cardiology consulted for preop eval for large AAA and iliac aneurysm.  1) Preop evaluation for AAA/iliac artery aneurysm repair underlying CAD on CT, significant SOB, fatigue, unable to exclude underlying  ischemia (very sedentary, worse recently with nerve compression of the right leg causing foot/leg weakness). --Suggest lexiscan stress test for risk stratification, can be done tomorrow --Will hold atenolol and amlodipine for stress testing in AM Wednesday --echo for SOB, possible pulmonary HTN.  2) COPD: long smoking hx, CT with COPD changes. not on inhalers, in denial with oxygen at home, feels better here on oxygen  3) AAA, iliac artery aneurysm vascular surgery to evaluation Unclear if he would be a candidate for stent. Compression on nerves, causing right foot drop. Would favor suregry this admission  4) HTN: Will hold meds for stress test, then can restart tomorrow after stress test.  5) Hyperlipidemia: would continue asa, statin   Electronic Signatures: Ida Rogue (MD)  (Signed 03-Dec-14 09:21)  Authored: General Aspect/Present Illness, History and Physical Exam, Past Medical History, Home Medications, Labs, EKG , Radiology, Allergies, Vital Signs/Nurse's Notes, Impression/Plan   Last Updated: 03-Dec-14 09:21 by Ida Rogue (MD)

## 2015-01-22 NOTE — Op Note (Signed)
PATIENT NAME:  Charles Barton, CARAS MR#:  [id number] DATE OF BIRTH:  11-24-1940  DATE OF PROCEDURE:  09/05/2013  PREOPERATIVE DIAGNOSES: 1.  Abdominal aortic aneurysm.  2.  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  3.  Advanced chronic obstructive pulmonary disease, oxygen dependent.  4.  Coronary artery disease.   POSTOPERATIVE DIAGNOSES:  1.  Abdominal aortic aneurysm.  2.  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  3.  Advanced chronic obstructive pulmonary disease, oxygen dependent.  4.  Coronary artery disease.   PROCEDURE PERFORMED: 1.  Introduction catheter into aorta, left femoral approach.  2. Introduction catheter into tertiary branches of the right internal iliac artery, left radial approach.  3.  Coil embolization of the right internal iliac artery.   PROCEDURE PERFORMED BY:  Katha Cabal, M.D.   SEDATION: Versed 7 mg plus fentanyl 300 mcg administered IV.   Continuous ECG, pulse oximetry and cardiopulmonary monitoring was performed throughout the entire procedure by the Interventional Radiology nurse. Total sedation time was 2hours.   ACCESS:  1.  A 5 French sheath, left common femoral artery.  2.  A 5 French sheath, left radial artery.   CONTRAST USED: Isovue 85 mL  FLUOROSCOPY TIME: 42.5 minutes.   INDICATIONS: Charles Barton is a 74 year old gentleman who presented to his internist's office with right leg paresthesias and foot drop. 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 The risks and benefits for endovascular aneurysm repair were reviewed in detail. Given the patient's pulmonary condition, this is truly his best option, and he has agreed to proceed. In order to achieve adequate result, embolization of the right internal iliac artery must be undertaken so that his right can be  extended into the external to exclude the 6 cm common iliac artery aneurysm. The risks and benefits have been discussed with him regarding this, and he has agreed to proceed with this as well.   DESCRIPTION OF PROCEDURE: The patient is taken to special procedures and placed in the supine position. After adequate sedation is achieved, both groins are prepped and draped in a sterile fashion. Appropriate timeout is called.   Ultrasound is placed in a sterile sleeve. Ultrasound is utilized secondary to lack of appropriate landmark and to avoid vascular injury. Under direct visualization, common femoral artery is identified. Femoral bifurcation is identified. The midportion of the common femoral artery is then selected, 1% lidocaine is infiltrated, and access is obtained with a micropuncture needle, microwire followed by micro sheath, J-wire followed by a 5 French sheath and 5 French pigtail catheter. Marker pigtail catheter is used. It is positioned at the level of T12, and an AP projection of the aorta is obtained with the marker in place so that length measurements can be verified. Subsequently, the focus was on crossing the bifurcation. Unfortunately, secondary to the  significant redundancy and tortuosity of the iliac system as well as the very large size of his aneurysm crossing from left to right, was very difficult, although the wire could be negotiated down into the external. Catheters would not cross. Multiple different catheters, including VS1, VCGs, C2, pigtail catheters were tried, but secure access to the right iliac could not be obtained. After multiple tries, it was elected to then proceed from the arm.   The patient was then repositioned  with his left arm extended, palm upward. The left arm was prepped and draped in a sterile fashion. Ultrasound was utilized to identify the radial artery. The radial artery was noted to be patent secondary to pulsatility and a homogeneous vessel. Micropuncture  needle was inserted, microwire followed by micro sheath, J-wire followed by a 5 French sheath. A 5 French 90-cm Ansell sheath was then advanced over the wire, after the Magic torque wire was negotiated into the descending aorta under fluoroscopic guidance. Next, several different wires and catheters were again required. The patient has sharp angulation to the left just below the aortic neck, and this drove the wires down the left iliac. Further complicating matters was the 90 cm sheath. It just barely made it to the level of the diaphragm and, therefore, we were limited in the number of catheters available that were long enough to perform this procedure. Ultimately, using a large angle on a Versacore wire in association with a straight 150 catheter, I was able to negotiate the wire catheter into the iliac. Subsequently, the right internal iliac was selected. Hand injection of contrast verified placement within the tertiary branches of the internal iliac. At a branching point just below what appears to be the internal iliac artery aneurysm, coils were deployed. A total of 6 coils was used, three 12 x 14 coils, two 14 x 14 coils, and one 16 x 14 coil. At the conclusion, follow up angiography was performed, which demonstrated virtually no flow from the aneurysm into the internal iliac artery. The catheter was then removed over a wire. The sheath was then pulled back, and an 11 cm sheath was placed. Subsequently, an  external closure device was deployed without difficulty, and the radial site secured.   Oblique view of the left groin was then obtained, and initially a Perclose device was attempted. This did not capture the wire. On the next one, pulsatile blood flow could not be found through the tube, and ultimately attempts at a StarClose device failed as well. The 5 French sheath, having been removed for deployment of the StarClose, manual pressure was now held. A small hematoma was noted.   INTERPRETATION: The  abdominal aorta is imaged with contrast. Subsequently, a femoral radial approach with successful selection of the internal iliac artery on the right was achieved, and successful coil embolization. The followup images demonstrate virtually no flow, as described above.   SUMMARY: Successful coil embolization of the right internal iliac artery in preparation for endovascular repair of his abdominal and iliac artery aneurysms.    ____________________________ Katha Cabal, MD ggs:mr D: 09/05/2013 17:55:00 ET T: 09/05/2013 21:59:35 ET JOB#: [id number] Dolores Lory SCHNIER MD ELECTRONICALLY SIGNED 09/08/2013 17:26

## 2015-01-22 NOTE — Consult Note (Signed)
Brief Consult Note: Diagnosis: COPD exacerbation;  AAA 7cm.   Patient was seen by consultant.   Recommend to proceed with surgery or procedure.   Discussed with Attending MD.   Comments: will plan embolization of the right internal iliac artery on Friday and for repair of the aneurysm on Monday.  Electronic Signatures: Hortencia Pilar (MD)  (Signed 03-Dec-14 19:39)  Authored: Brief Consult Note   Last Updated: 03-Dec-14 19:39 by Hortencia Pilar (MD)

## 2015-01-22 NOTE — Discharge Summary (Signed)
PATIENT NAME:  Charles Barton, Charles Barton MR#:  [id number] DATE OF BIRTH:  1940/12/11  DATE OF ADMISSION:  09/02/2013 DATE OF DISCHARGE:  09/10/2013  PRIMARY CARE PHYSICIAN:  Guadalupe Maple, MD  DISCHARGE DIAGNOSES:  Acute on chronic respiratory failure, mild chronic obstructive pulmonary disease exacerbation, abdominal aortic aneurysm, chronic iliac artery thrombosis.   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   CONDITION: Stable.   CODE STATUS: Full code.   HOME MEDICATIONS: Please refer to the [facility] physician discharge instruction medication reconciliation list. The patient needs home oxygen 5 liters by nasal cannula.   DIET: Low-sodium, low-fat, low-cholesterol diet.   ACTIVITY: As tolerated.   FOLLOWUP CARE: Follow up with PCP within 1 to 2 weeks. Follow up with Dr. Lucky Cowboy within 1 to 2 weeks   REASON FOR ADMISSION: Low oxygen saturation and AAA.   HOSPITAL COURSE: The patient is a 74 year old Caucasian male with a history of severe COPD, hypertension, hyperlipidemia and shortness of breath for 2 to 3 months with cough and nonproductive sputum. The patient's O2 saturation was noted to be 79% in PCPs office. In addition, the patient's x-ray shows some 7 cm abdominal aortic aneurysm. The patient was sent to the ED for further evaluation. CT angio of the abdomen and pelvis showed 7 cm abdominal aortic aneurysm below renal arteries and another common iliac aneurysm that was 5.9 cm of the right iliac artery about 2.7 cm. For detailed history and physical examination, please refer to the admission note dictated by Dr. Lunette Stands.  On admission date, laboratory data showed CBC was normal, CMP normal. Troponin less than 0.02. ABG showed pH 7.37, pO2 of 61 on 2 liters oxygen.  1.  Hypoxia, acute on chronic respiratory failure with COPD exacerbation. After admission, the patient was treated with Solu-Medrol, IVPB  with nebulizer p.r.n. In addition, the patient required 5 liters oxygen by nasal cannula with O2 saturation about 90% to 92%. The patient's symptoms have much improved so the patient's Solu-Medrol was discontinued and changed to p.o. prednisone with nebulizer.  2.  AAA. The patient underwent endovascular repair of abdominal aortic aneurysm yesterday. In addition, the patient underwent coil embolization of chronic iliac artery thrombosis of the right lower extremity. After these procedures, the patient has no active bleeding. The patient has no complaints. His vital signs are stable. He is clinically stable and will be discharged to home today.  3.  The patient has severe COPD with chronic respiratory failure, need home oxygen 5 liters by nasal cannula. Discussed the discharge plan with the patient and the patient's wife and son, nurse and case Freight forwarder.   TIME SPENT: About 36 minutes.   ____________________________ Demetrios Loll, MD qc:cs D: 09/10/2013 14:54:00 ET T: 09/10/2013 15:21:44 ET JOB#: [id number]  cc: Demetrios Loll, MD, <Dictator> Demetrios Loll MD ELECTRONICALLY SIGNED 09/11/2013 13:26

## 2015-01-22 NOTE — H&P (Signed)
PATIENT NAME:  Charles Barton, LEHNEN MR#:  [id number] DATE OF BIRTH:  1940-11-08  DATE OF ADMISSION:  09/02/2013  PRIMARY CARE PHYSICIAN: Dr. Golden Pop.   REFERRING PHYSICIAN: Marijo Conception, PA-C.   CHIEF COMPLAINT: Sent from the primary care physician's office for low oxygen saturations and abdominal aortic aneurysm.    HISTORY OF PRESENT ILLNESS: Charles Barton is a 74 year old, pleasant, white male who had history of heavy smoking but quit 4 to 5 years back, severe COPD, hypertension, hyperlipidemia. Has been experiencing worsening of the shortness of breath for the last 2 to 3 months. Has some cough with no productive sputum. The patient also has been experiencing numbness in the right leg with decreased sensation as well as mobility. As part of the yearly checkup, he went to his primary care physician. There, the patient was initially found to have oxygen saturations of 79%. The patient complained of the right leg numbness. Concerning this, x-rays were obtained which showed a 7 cm abdominal aortic aneurysm. Concerning this, the patient was sent to the Emergency Department. 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 The common iliac artery aneurysm containing thrombus. The patient also noticed mild increased swelling in the lower extremities. Concerning the patient's shortness of breath, his mobility worsened in the last few days. Denies having any fever. Denies having any chest pain, palpitations. Prior to this, the patient was active. Goes everyday out and spends time with his friends and family.   PAST MEDICAL HISTORY:  1. COPD.   2. Hypertension.  3. Hyperlipidemia.   ALLERGIES: No known drug allergies.   HOME MEDICATIONS:  1. Symbicort 2 puffs 2 times a day.  2. Spiriva 18 mcg once a day.  3. Flonase 50 mcg  2 sprays once a day.  4. Atorvastatin 40 mg once a day.  5. Atenolol 50 mg once a day.  6. Aspirin 81 mg once a day.  7. Amlodipine 5 mg once a day.  8. Albuterol 2 puffs every 6 hours as needed.   SOCIAL HISTORY: Smoked heavily in the past, quit 4 to 5 years back. Prior to that smoked 2 packs a day for 40 years. Denies drinking alcohol or using illicit drugs. Retired from. [employer] of [employer]s and IADLs; however, has been experiencing shortness of breath with exertion lately.   FAMILY HISTORY: History of heart problems.   REVIEW OF SYSTEMS:  CONSTITUTIONAL: Denies any generalized weakness.  EYES: No change in vision.  ENT: No change in hearing.  RESPIRATORY: Has cough and shortness of breath.  CARDIOVASCULAR: No chest pain, palpitations. Has mild pedal edema.  GASTROINTESTINAL: No nausea, vomiting, abdominal pain.  GENITOURINARY: No dysuria or hematuria.  HEMATOLOGIC: No easy bruising or bleeding.  SKIN: No rash or lesions.  ENDOCRINE: No polyuria or polydipsia.  MUSCULOSKELETAL: No joint pains and aches.  NEUROLOGIC: Has numbness of the right lower extremity.   PHYSICAL EXAMINATION:  GENERAL: This is a well-built, well-nourished, very pleasant male lying down in the bed, not in distress.  VITAL SIGNS: Temperature 98.3, pulse 70, blood pressure 122/77, respiratory rate of 18, oxygen saturation is 93% on 4 liters of oxygen.  HEENT: Head normocephalic, atraumatic. There is no scleral icterus. Conjunctivae normal. Pupils equal and react to light. Extraocular movements are intact. Mucous membranes moist. No pharyngeal erythema.  NECK: Supple. No lymphadenopathy. No JVD. No carotid bruit.  CHEST: Has no focal tenderness.  LUNGS: Bilaterally clear to auscultation.  HEART: S1, S2 regular. No murmurs are heard. Has mild pedal edema in both lower extremities, right somewhat more than the left.  ABDOMEN: Obese. Bowel sounds present. Soft, nontender, nondistended. Could not appreciate any  hepatosplenomegaly.  SKIN: No rash or lesions.  MUSCULOSKELETAL: Good range of motion in all of the extremities.  NEUROLOGIC: The patient is alert, oriented to place, person and time. Cranial nerves II through XII intact. Motor 5/5 in upper and lower extremities. Has decreased sensation in the right lower extremity below the calf.    LABORATORIES: CMP is completely within normal limits. Troponin less than . CBC: WBC of 11.3, hemoglobin , platelet count of 179. Coag profile is well within normal limits. ABG: PH of 7.37, pCO2 of , PaO2 of 61 on 2 liters of oxygen.   CT ABDOMEN AND PELVIS:  1. Coronary atherosclerosis with calcified plaque noted in the distribution of the LAD and right coronary artery.  2. 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  3. Prominent right distal abdominal aortic aneurysm measuring 7 cm in the greatest diameter at the level of the aortic bifurcation and 6.7 cm in the main body of the fusiform aneurysm. No associated dissection or evidence of aneurysm rupture.  4. 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    ASSESSMENT AND PLAN: Charles Barton is a 74 year old pleasant male with history of chronic obstructive pulmonary disease. Comes to the Emergency Department with complaints of shortness of breath and hypoxia.  1. Hypoxia: This could be a combination of chronic obstructive pulmonary disease exacerbation and congestive heart failure. The patient does not have any known history of congestive heart failure in the past. The patient's prolonged history of hypoxia could have led to congestive heart failure. The patient has bibasilar crackles. Will treat chronic obstructive pulmonary disease with breathing treatments and Solu-Medrol and keep the patient on Lasix 40 mg intravenous b.i.d.  2. Chronic obstructive pulmonary disease exacerbation: The patient has history of  heavy smoking. Keep the patient on Solu-Medrol and breathing treatments.  3. Congestive heart failure: Will get a BNP. Keep the patient on Lasix. Obtain echocardiogram. Obtain cardiology consult. The patient is on atenolol. Will not start the patient on ACE inhibitor until surgery is done for the abdominal aortic aneurysm.  4. Abdominal aortic aneurysm: The patient has critical dilatation of the aneurysm. Discussed with the family the high risk and possible rupture. Also consulted vascular surgery who is planning to do the surgery once the patient's pulmonary function improves. Consult cardiology for preop evaluation concerning the patient's complex history of complex surgery on the vasculature. Will also consult pulmonary to treat his chronic obstructive pulmonary disease as well as recommendations from pulmonary standpoint.  5. 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 Will discontinue once the surgery is planned.  6. Hypertension: Currently well controlled. Continue with home medications.  7. The patient is on therapeutic dose of heparin.    TIME SPENT: 60 minutes.   ____________________________ Monica Becton, MD pv:gb D: 09/02/2013 21:56:50 ET T: 09/02/2013 23:05:59 ET JOB#: [id number]  cc: Monica Becton, MD, <Dictator> Guadalupe Maple, MD Monica Becton MD ELECTRONICALLY SIGNED 09/10/2013 21:27

## 2015-01-22 NOTE — Op Note (Signed)
PATIENT NAME:  Charles Barton, PRESLAR MR#:  [id number] DATE OF BIRTH:  02-09-1941  DATE OF OPERATION:  09/08/2013  PREOPERATIVE DIAGNOSES: 1.  Abdominal aortic aneurysm, with large right iliac artery aneurysm.  2.  Chronic obstructive pulmonary disease.  3.  Hypertension.  4.  Hyperlipidemia.   POSTOPERATIVE DIAGNOSES:  1.  Abdominal aortic aneurysm, with large right iliac artery aneurysm.  2.  Chronic obstructive pulmonary disease.  3.  Hypertension.  4.  Hyperlipidemia.   PROCEDURE:  1.  Ultrasound guidance for vascular access to bilateral femoral arteries, right by Dr. Delana Meyer, left by Dr. Lucky Cowboy.  2.  Second order catheter placement from left, first order catheter placement in aorta from right.  3.  Aortogram and iliofemoral arteriogram.  4.  Placement of Endologix endoprosthesis, main body, left.  5.  Placement of a Gore iliac limb on the right.  6.  Placement of an aortic extension cuff, the last 3 are co-surgeons.  7.  Perclose closure device bilateral femoral arteries, right by Dr. Delana Meyer, left by Dr. Lucky Cowboy.   ANESTHESIA: General.   BLOOD LOSS: Approximately 100 mL  INDICATION FOR PROCEDURE: This is a 74 year old gentleman with a large aortoiliac aneurysm. At maximal diameter, it is greater than 7 cm. The right iliac component is quite large. He has had coil embolization of the right hypogastric artery preliminary to repair due to the large right iliac artery aneurysm. He has been brought in today for planned definitive repair with an endovascular stent graft. Risks and benefits were discussed. Informed consent was obtained.   DESCRIPTION OF PROCEDURE: The patient was brought to the vascular radiology suite. Groins were shaved and prepped, and a sterile surgical field was created. Both femoral arteries were accessed under direct ultrasound guidance without difficulty with a Seldinger needle. Dr. Delana Meyer performed the right, I performed the left, and we placed 2 Pro-Glides in the Preclose  fashion. Then 8 French sheaths were placed, and the patient was systemically heparinized. We initially started by taking a long iliac limb on the right, after placing a 12 French sheath. A Gore excluder limb was used, and it was deployed up to but not beyond the origin of the right common iliac artery. This went down to the right external iliac artery, and the hypogastric artery was previously coil embolized. I then took a Lunderquist wire up from the left, and the main body was placed up the left side with the pigtail catheter from the right. We pulled out of the aortic bifurcation and deployed the main body. We then exchanged with the 0.014 wire for a pigtail catheter, and magnified imaging was performed at the renal arteries. We then deployed a 34 mm suprarenal cuff with the fabric just below the base of the left renal artery from the left femoral approach, and used the Compliant balloon to iron out the juncture points and seal zones in the aorta and the seal zone in the left iliac artery. Dr. Delana Meyer then took the Compliant balloon from the right to iron out the junction points in the right iliac artery and the right iliac landing zone in the external iliac artery. Completion angiogram was then performed. This demonstrated no apparent endoleaks. There was good flow within both renal arteries, within the left hypogastric artery, the right hypogastric artery being previously coil embolized. The stent graft was widely patent.   At this point, we elected to terminate the procedure. Both sheaths were removed. A third Pro-glide was used on the left, and hemostasis  was achieved bilaterally. The skin was closed with a 4-0 Monocryl, and Dermabond was placed. The patient was then awakened from anesthesia, having tolerated the procedure well.   ____________________________ Algernon Huxley, MD jsd:mr D: 09/08/2013 15:34:00 ET T: 09/08/2013 20:52:47 ET JOB#: [id number]  cc: Algernon Huxley, MD, <Dictator> Algernon Huxley  MD ELECTRONICALLY SIGNED 09/22/2013 10:12

## 2015-01-23 NOTE — H&P (Signed)
PATIENT NAME:  Charles Barton, KATZENSTEIN MR#:  [id number] DATE OF BIRTH:  02-21-1941  DATE OF ADMISSION:  02/21/2014  REFERRING PHYSICIAN:  Dr. Cinda Quest.   PRIMARY CARE PHYSICIAN:  Dr. Jeananne Rama.   CHIEF COMPLAINT:  Dizziness, shortness of breath.   HISTORY OF PRESENT ILLNESS:  A 74 year old Caucasian gentleman with past medical history of COPD on 3 liters nasal cannula at baseline, presenting with dizziness.  Described 3 to 4 day duration of symptoms and he describes the dizziness mainly as "swimmy headed."  No lightheadedness.  No frank room spinning.  He suffered a mechanical fall about four days ago.  Since that time had worsening shortness of breath with associated cough, productive of small amounts of whitish sputum.  Denies fevers or chills; however has noted generalized weakness and fatigue.  Of note, he is hard of hearing at baseline as well as with baseline tinnitus which is unchanged.  In the Emergency Department had symptoms of dizziness, received meclizine as well as Valium and that has improved; however on my examination noted to be hypoxemic, saturating in the low 80s despite being on 4 to 5 liters supplemental O2.   REVIEW OF SYSTEMS:  CONSTITUTIONAL:  Denies fever, chills.  Positive for fatigue, weakness.  EYES:  Denied blurred vision, double vision or eye pain.  EARS, NOSE, THROAT:  Denies tinnitus, ear pain, hearing loss.  RESPIRATORY:  Denies cough, wheeze.  Positive for shortness of breath.  CARDIOVASCULAR:  Denies chest pain, palpitations, edema.  GASTROINTESTINAL:  Positive for nausea, vomiting x 1.  Denies any abdominal pain, diarrhea.  GENITOURINARY:  Denies dysuria, hematuria.  ENDOCRINE:  Denies nocturia or thyroid problems. HEMATOLOGY AND LYMPHATIC:  Denies easy bruising or bleeding.  SKIN:  Denies rash or lesion.  MUSCULOSKELETAL:  Denies pain in neck, back, shoulder, knees, hips or arthritic symptoms.  NEUROLOGIC:  Denies paralysis, paresthesias.  PSYCHIATRIC:  Denies anxiety  or depressive symptoms.  Otherwise, full review of systems performed by me is negative.   PAST MEDICAL HISTORY:  COPD, 3 liters nasal cannula at baseline, hypertension, hyperlipidemia, abdominal aortic aneurysm status post endovascular repair.   SOCIAL HISTORY:  Positive for remote tobacco abuse.  Denies any alcohol or drug usage.   FAMILY HISTORY:  Positive for COPD.   ALLERGIES:  No known drug allergies.   HOME MEDICATIONS:  Include atenolol 50 mg by mouth daily, Spiriva 18 mcg inhalation daily, Symbicort 80/4.5 mcg inhalation 2 puffs twice daily, Flonase 50 mcg two sprays daily.   PHYSICAL EXAMINATION: VITAL SIGNS:  Heart rate 102, respirations 24, blood pressure 149/103, saturating 88% on supplemental O2.  Weight 90.7 kg, BMI 27.9.  GENERAL:  Well-nourished, well-developed, Caucasian gentleman, currently in minimal distress secondary to respiratory status.  HEAD:  Normocephalic, atraumatic.  EYES:  Pupils equal, round, reactive to light.  Extraocular muscles intact.  No scleral icterus.  MOUTH:  Moist mucosal membrane.  Dentition intact.  No abscess noted.  EAR, NOSE, THROAT:  Throat clear without exudates.  No external lesions.  NECK:  Supple.  No thyromegaly.  No nodules.  No JVD.  PULMONARY:  Diminished breath sounds throughout all lung fields without frank wheezes, rubs or rhonchi.  No use of accessory muscles, though tachypneic.  Good respiratory effort.  CHEST:  Nontender to palpation.  CARDIOVASCULAR:  S1, S2, tachycardic . No murmurs, rubs, or gallops.  No edema.  Pedal pulses 2+ bilaterally.  GASTROINTESTINAL:  Soft, nontender, nondistended.  No masses.  Positive bowel sounds.  No hepatosplenomegaly.  MUSCULOSKELETAL:  No swelling, clubbing, or edema.  Range of motion full in all extremities.  NEUROLOGIC:  Cranial nerves II through XII intact.  No gross focal neurological deficits.  Sensation intact.  Reflexes intact. SKIN:  No ulceration, lesion, rashes, cyanosis.  Skin warm  and dry, intact.  PSYCHIATRIC:  Mood and affect within normal limits.  The patient is awake, alert, oriented x 3.  Insight and judgment intact.    LABORATORY DATA:  CT head performed revealing no acute intracranial process.  Chest x-ray performed revealing emphysematous changes and bilateral lung scarring, no acute cardiopulmonary process.  Remainder of laboratory data:  Sodium 136, potassium 4.3, chloride 103, bicarb 27, BUN 24, creatinine 1.27, glucose 97.  Troponin I 0.04.  WBC 16.8, hemoglobin 17.4, platelets of 230.  Urinalysis negative for evidence of infection.   ASSESSMENT AND PLAN:  A 74 year old gentleman with history of chronic obstructive pulmonary disease, on 3 liters nasal cannula at baseline, presenting with dizziness as well as shortness of breath.  1.  Chronic obstructive pulmonary disease exacerbation with hypoxemia despite supplemental O2.  We will provide supplemental oxygen to keep oxygen saturation greater than 88%.  DuoNeb therapy q. 4 hours, incentive spirometry, Solu-Medrol 60 mg daily.  We will also dose azithromycin given sputum and leukocytosis.  However, no pneumonia on chest x-ray.  2.  Dizziness, no nystagmus, seems his symptoms have markedly improved after receiving some Antivert.  We will continue this medication, unable to do induce symptoms with the Dix-Hallpike maneuver.  I question if hypoxemia is at least partially responsible for his symptoms; however, he is improved at this time.  3.  Hypertension.  Continue with atenolol.  4.  Venous thromboembolism prophylaxis with heparin subQ.  5.  CODE STATUS:  THE PATIENT IS DO NOT RESUSCITATE.   TIME SPENT:  45 minutes.     ____________________________ Aaron Mose. Shamone Winzer, MD dkh:ea D: 02/21/2014 22:00:06 ET T: 02/22/2014 00:03:13 ET JOB#: [id number]  cc: Aaron Mose. Meka Lewan, MD, <Dictator> Caddie Randle Woodfin Ganja MD ELECTRONICALLY SIGNED 02/22/2014 20:31

## 2015-01-23 NOTE — Op Note (Signed)
PATIENT NAME:  Charles Barton, Charles Barton MR#:  [id number] DATE OF BIRTH:  07-06-1941  DATE OF PROCEDURE:  09/08/2013  PREOPERATIVE DIAGNOSES: 1.  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  2.  Chronic obstructive pulmonary disease, oxygen dependent.  3.  Hypertension.   POSTOPERATIVE DIAGNOSES:  1.  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  2.  Chronic obstructive pulmonary disease, oxygen dependent.  3.  Hypertension.   PROCEDURES PERFORMED:  1.  Introduction of catheter into aorta, right femoral approach.  2.  Introduction catheter into aorta, left femoral approach.  3.  Ultrasound guidance for vascular access bilateral femoral arteries.  4.  Abdominal aortogram with iliofemoral angiogram.  5. Placement of Endologix endoprosthesis for endovascular repair of abdominal aortic aneurysm, left groin approach.  6.  Placement of Gore iliac limb on the right side for exclusion of the right iliac artery aneurysm.  7.  Placement of an aortic extension cuff, Endologix for complete exclusion of the abdominal aortic aneurysm.  8. Percutaneous closure device using the Perclose the pre-close fashion bilateral femoral arteries.   ANESTHESIA: General by endotracheal intubation.   FLUIDS: Per anesthesia record.   ESTIMATED BLOOD LOSS: 100 mL   SPECIMEN: None.   FLUOROSCOPY TIME: Approximately 10 minutes.   ACCESS:  1.  18 French sheath, left common femoral artery.  2.  12 French sheath, right common femoral artery.   CONTRAST USED: Isovue, approximately 80 mL.   INDICATIONS: Mr. Perrault is a 74 year old gentleman who presented to his primary care with the complaint of increasing pain in the buttock and loss of motor function in the right leg. X-rays of the lower back demonstrated a very large calcified rim consistent with abdominal aortic aneurysm. He subsequently underwent CT angiography and was found to have a 7 cm abdominal aortic aneurysm with a 6 cm  right common iliac artery aneurysm. The risks and benefits for repair of abdominal aortic aneurysm as well as the iliac component to prevent lethal rupture were reviewed. All questions were answered. The patient has agreed to proceed.   DESCRIPTION OF PROCEDURE: The patient is taken to the special procedure suite, placed in the supine position. After adequate general anesthesia is induced and appropriate invasive monitors are placed, he is positioned supine and prepped from the nipple line to the knees. He is then draped in a sterile fashion. Appropriate timeout is called.   Working myself on the right side and Dr. Lucky Cowboy on the left,  ultrasound was placed in a sterile sleeve. Common femoral arteries are identified. They are echolucent and pulsatile indicating patency. Image is recorded for the permanent record and under direct visualization, Seldinger needles are inserted. Subsequently, a 6 French sheaths are placed. The wires, followed by a pigtail catheter are then negotiated from the right into the aorta and bolus injection of contrast is utilized to demonstrate the flow divider as well as the normal external iliac artery. Once these measurements were made, a Gore 16 to 12 contralateral limb, 14 cm in length was selected. The stiff Lunderquist wire was advanced through the pigtail and positioned with its tip in the proximal ascending aorta. Subsequently, a 12 French sheath was inserted. The patient was given 6000 units of heparin. The Gore Excluder device was then positioned at the flow divider extending down into the external and deployed without difficulty. The pigtail catheter was then advanced up the left side representing introduction of the catheter into the aorta. Subsequently picture was obtained localizing  the renals. Length measurements were made and a 28 diameter x 100 length x 20 mm diameter limb x  40 length limb AFX device was selected. Lunderquist wire was then advanced up the left side, and  subsequently the 18 French sheath was advanced up the left side.   The AFX bifurcated device was then placed onto the wire and the Surpass wire was advanced through the sheath, using the wire guide Surpass wire was then snared and pulled out the right side by myself after passing the snare up the 12 French sheath. Subsequently, the bifurcated device was advanced into the aneurysm under fluoro. It was opened, care was taken to insure there was no wire wrap and subsequently it was pulled down and feeded on the bifurcation. The  main body was then completely deployed. The 0.014 [place] wire was advanced through the contralateral limb hypotube and subsequently the contralateral limb was deployed into the [employer] sheath. The pigtail catheter was then advanced up the 0.014 wire and magnified imaging of the renals was performed. Subsequently, a 34 diameter x 100 suprarenal stent cuff was advanced through the left sheath and deployed just below the renals. Coda balloon was then used to seal all zones. Catheter was then advanced up the left side and subsequently angiography was performed with delayed imaging. No endoleaks were identified. There appears to be excellent seal at both the right and left iliacs.   A 12 French sheath was removed over wire and subsequently the pre-close ProGlide devices were used to seal this. Excellent seal is noted. Wire was removed, knots were cinched and hemostasis was achieved. On the left side,  the two pre-close devices were not yielding complete hemostasis and a third device was deployed and this resulted in excellent hemostasis. Pressure was held for approximately 10 minutes and subsequently the knots were secured, and the sutures clipped. Sterile dressings were applied. The patient tolerated the procedure well. There were no immediate complications. Sponge and needle counts were correct and he was taken to the recovery area in stable condition.     ____________________________ Katha Cabal, MD ggs:cc D: 09/10/2013 17:26:00 ET T: 09/10/2013 20:03:19 ET JOB#: [id number]  cc: Guadalupe Maple, MD Katha Cabal, MD, <Dictator>   Katha Cabal MD ELECTRONICALLY SIGNED 10/07/2013 19:28

## 2015-01-23 NOTE — Discharge Summary (Signed)
PATIENT NAME:  Charles Barton, Charles Barton MR#:  [id number] DATE OF BIRTH:  1941-09-01  DATE OF ADMISSION:  02/21/2014 DATE OF DISCHARGE:  02/27/2014  PRIMARY CARE PHYSICIAN: Dr. Jeananne Rama.   DISCHARGE DIAGNOSES:  1. Dizziness, positional vertigo.  2. Acute on chronic respiratory failure, on oxygen 4 liters. 3. Pneumonia with lung mass.  4. Dehydration.  5. Hypertension.  6. Chronic obstructive pulmonary disease exacerbation.   CONDITION: Stable.   CODE STATUS: DNR.   HOME MEDICATIONS: Please refer to the medication reconciliation list. The patient needs home health, physical therapy, and a nurse. The patient needs home oxygen 4 liters by nasal cannula.   DIET: Low-sodium diet.   ACTIVITY: As tolerated.   FOLLOWUP CARE: Follow with PCP within 1 to 2 weeks. Follow up with Dr. Humphrey Rolls, pulmonary Dr. Humphrey Rolls within 1 week. The patient needs repeat a chest CT in 6 to 8 weeks.   CONSULTATIONS: Dr. Mortimer Fries.   REASON FOR ADMISSION: Dizziness and shortness of breath.   HOSPITAL COURSE: The patient is a 74 year old Caucasian male with a history of COPD on home oxygen 3 liters, presented to the ED with dizziness and shortness of breath. The patient's oxygen saturation decreased to low 80s despite being on 4-5 liters oxygen. For a detailed history and physical examination, please refer to the admission note dictated by Dr. Lavetta Nielsen.   LABORATORY DATA: On admission date showed CAT scan of head did not show any acute intracranial process. Chest x-ray showed emphysematous changes with bilateral lung scarring. No acute cardiopulmonary process.   Sodium 136, potassium 4.3, chloride 103, BUN 24, creatinine 1.27. Troponin 0.04, WBC 16.8, hemoglobin 17.4. Urinalysis negative.   PROBLEMS:  1. Chronic obstructive pulmonary disease exacerbation with pneumonia. After admission, the patient has been treated with nebulizer, supplemental oxygen, and Levaquin. The patient's white count decreased to 12.5. Symptoms are much better.   2. Acute on chronic respiratory failure, possibly due to chronic obstructive pulmonary disease exacerbation and pneumonia with lung mass. The patient has been on oxygen and nebulizer treatment as mentioned above. The patient's symptoms have much improved. However, the patient still needs 4 liters oxygen. His baseline oxygen was 3 liters.  3. Lung mass per CAT scan of chest, the patient possiblely has a lung mass. Dr. Mortimer Fries evaluated patient and suggested treating the pneumonia and then repeat a chest CT within 6-8 weeks and follow up with pulmonary Dr. Humphrey Rolls as outpatient.  4. Hypertension. The patient is on atenolol.  5. Dizziness which is possibly due to positional vertigo. The patient was treated with Antivert. Symptoms have improved.  6. The patient has no complaints. Vital signs are stable. Physical examination, showed week breath sounds but no wheezing or crackles. The patient is clinically stable, will be discharged to home with PT and home health. I discussed the patient's discharge plan with the patient, nurse, case manager.   TIME SPENT: About 38 minutes.    ____________________________ Demetrios Loll, MD qc:lt D: 02/27/2014 16:28:42 ET T: 02/28/2014 03:14:20 ET JOB#: [id number]  cc: Demetrios Loll, MD, <Dictator> Demetrios Loll MD ELECTRONICALLY SIGNED 02/28/2014 15:09

## 2015-02-11 ENCOUNTER — Other Ambulatory Visit: Payer: Self-pay | Admitting: *Deleted

## 2015-02-11 DIAGNOSIS — D751 Secondary polycythemia: Secondary | ICD-10-CM | POA: Insufficient documentation

## 2015-02-12 ENCOUNTER — Inpatient Hospital Stay: Payer: Commercial Managed Care - HMO

## 2015-02-12 ENCOUNTER — Inpatient Hospital Stay: Payer: Commercial Managed Care - HMO | Attending: Oncology

## 2015-02-12 DIAGNOSIS — D751 Secondary polycythemia: Secondary | ICD-10-CM

## 2015-02-12 DIAGNOSIS — D45 Polycythemia vera: Secondary | ICD-10-CM | POA: Insufficient documentation

## 2015-02-12 LAB — CBC WITH DIFFERENTIAL/PLATELET
BASOS ABS: 0.1 10*3/uL (ref 0–0.1)
BASOS PCT: 1 %
Eosinophils Absolute: 0.2 10*3/uL (ref 0–0.7)
Eosinophils Relative: 2 %
HEMATOCRIT: 51.8 % (ref 40.0–52.0)
HEMOGLOBIN: 17.1 g/dL (ref 13.0–18.0)
Lymphocytes Relative: 20 %
Lymphs Abs: 1.8 10*3/uL (ref 1.0–3.6)
MCH: 29.5 pg (ref 26.0–34.0)
MCHC: 32.9 g/dL (ref 32.0–36.0)
MCV: 89.7 fL (ref 80.0–100.0)
MONOS PCT: 12 %
Monocytes Absolute: 1.1 10*3/uL — ABNORMAL HIGH (ref 0.2–1.0)
NEUTROS PCT: 65 %
Neutro Abs: 5.9 10*3/uL (ref 1.4–6.5)
Platelets: 181 10*3/uL (ref 150–440)
RBC: 5.78 MIL/uL (ref 4.40–5.90)
RDW: 15 % — ABNORMAL HIGH (ref 11.5–14.5)
WBC: 9.1 10*3/uL (ref 3.8–10.6)

## 2015-02-16 ENCOUNTER — Other Ambulatory Visit: Payer: Self-pay | Admitting: Internal Medicine

## 2015-02-17 ENCOUNTER — Ambulatory Visit: Payer: Commercial Managed Care - HMO | Attending: Physician Assistant

## 2015-02-17 DIAGNOSIS — J9611 Chronic respiratory failure with hypoxia: Secondary | ICD-10-CM | POA: Diagnosis present

## 2015-02-17 DIAGNOSIS — I272 Other secondary pulmonary hypertension: Secondary | ICD-10-CM | POA: Diagnosis not present

## 2015-02-17 LAB — BLOOD GAS, ARTERIAL
Acid-base deficit: 1.6 mmol/L (ref 0.0–2.0)
Allens test (pass/fail): POSITIVE — AB
BICARBONATE: 21.5 meq/L (ref 21.0–28.0)
FIO2: 0.32 %
O2 Saturation: 83.1 %
PATIENT TEMPERATURE: 37
pCO2 arterial: 31 mmHg — ABNORMAL LOW (ref 32.0–48.0)
pH, Arterial: 7.45 (ref 7.350–7.450)
pO2, Arterial: 45 mmHg — ABNORMAL LOW (ref 83.0–108.0)

## 2015-03-11 ENCOUNTER — Other Ambulatory Visit: Payer: Self-pay | Admitting: Oncology

## 2015-03-12 ENCOUNTER — Inpatient Hospital Stay (HOSPITAL_BASED_OUTPATIENT_CLINIC_OR_DEPARTMENT_OTHER): Payer: Commercial Managed Care - HMO | Admitting: Oncology

## 2015-03-12 ENCOUNTER — Inpatient Hospital Stay: Payer: Commercial Managed Care - HMO | Attending: Oncology

## 2015-03-12 VITALS — BP 96/70 | HR 75 | Temp 96.6°F | Resp 20 | Wt 158.3 lb

## 2015-03-12 DIAGNOSIS — D751 Secondary polycythemia: Secondary | ICD-10-CM

## 2015-03-12 DIAGNOSIS — I2510 Atherosclerotic heart disease of native coronary artery without angina pectoris: Secondary | ICD-10-CM | POA: Insufficient documentation

## 2015-03-12 DIAGNOSIS — R0602 Shortness of breath: Secondary | ICD-10-CM | POA: Diagnosis not present

## 2015-03-12 DIAGNOSIS — Z8546 Personal history of malignant neoplasm of prostate: Secondary | ICD-10-CM | POA: Insufficient documentation

## 2015-03-12 DIAGNOSIS — Z79899 Other long term (current) drug therapy: Secondary | ICD-10-CM | POA: Insufficient documentation

## 2015-03-12 DIAGNOSIS — I10 Essential (primary) hypertension: Secondary | ICD-10-CM | POA: Insufficient documentation

## 2015-03-12 DIAGNOSIS — J449 Chronic obstructive pulmonary disease, unspecified: Secondary | ICD-10-CM | POA: Insufficient documentation

## 2015-03-12 DIAGNOSIS — E785 Hyperlipidemia, unspecified: Secondary | ICD-10-CM | POA: Insufficient documentation

## 2015-03-12 DIAGNOSIS — Z87891 Personal history of nicotine dependence: Secondary | ICD-10-CM | POA: Diagnosis not present

## 2015-03-12 DIAGNOSIS — I714 Abdominal aortic aneurysm, without rupture: Secondary | ICD-10-CM | POA: Insufficient documentation

## 2015-03-12 DIAGNOSIS — D45 Polycythemia vera: Secondary | ICD-10-CM | POA: Diagnosis not present

## 2015-03-12 LAB — CBC WITH DIFFERENTIAL/PLATELET
BASOS PCT: 1 %
Basophils Absolute: 0.1 10*3/uL (ref 0–0.1)
EOS ABS: 0.3 10*3/uL (ref 0–0.7)
Eosinophils Relative: 3 %
HEMATOCRIT: 52.8 % — AB (ref 40.0–52.0)
Hemoglobin: 17.5 g/dL (ref 13.0–18.0)
LYMPHS PCT: 20 %
Lymphs Abs: 1.9 10*3/uL (ref 1.0–3.6)
MCH: 29.1 pg (ref 26.0–34.0)
MCHC: 33.1 g/dL (ref 32.0–36.0)
MCV: 87.8 fL (ref 80.0–100.0)
MONO ABS: 1.2 10*3/uL — AB (ref 0.2–1.0)
MONOS PCT: 13 %
NEUTROS PCT: 63 %
Neutro Abs: 6 10*3/uL (ref 1.4–6.5)
Platelets: 167 10*3/uL (ref 150–440)
RBC: 6.02 MIL/uL — ABNORMAL HIGH (ref 4.40–5.90)
RDW: 16.4 % — ABNORMAL HIGH (ref 11.5–14.5)
WBC: 9.5 10*3/uL (ref 3.8–10.6)

## 2015-03-25 ENCOUNTER — Ambulatory Visit (INDEPENDENT_AMBULATORY_CARE_PROVIDER_SITE_OTHER): Payer: Commercial Managed Care - HMO | Admitting: Family Medicine

## 2015-03-25 ENCOUNTER — Encounter: Payer: Self-pay | Admitting: Family Medicine

## 2015-03-25 VITALS — BP 101/75 | HR 120 | Temp 97.6°F | Ht 70.5 in | Wt 158.0 lb

## 2015-03-25 DIAGNOSIS — I714 Abdominal aortic aneurysm, without rupture, unspecified: Secondary | ICD-10-CM

## 2015-03-25 DIAGNOSIS — M21371 Foot drop, right foot: Secondary | ICD-10-CM | POA: Diagnosis not present

## 2015-03-25 DIAGNOSIS — J449 Chronic obstructive pulmonary disease, unspecified: Secondary | ICD-10-CM | POA: Diagnosis not present

## 2015-03-25 DIAGNOSIS — I10 Essential (primary) hypertension: Secondary | ICD-10-CM | POA: Insufficient documentation

## 2015-03-25 DIAGNOSIS — D751 Secondary polycythemia: Secondary | ICD-10-CM | POA: Insufficient documentation

## 2015-03-25 MED ORDER — FLUTICASONE PROPIONATE 50 MCG/ACT NA SUSP: 1.0000 | Freq: Every day | NASAL | Status: AC

## 2015-03-25 NOTE — Assessment & Plan Note (Signed)
Some better but not gone AAA surgery helped some

## 2015-03-25 NOTE — Assessment & Plan Note (Signed)
Stable off meds and with wt loss

## 2015-03-25 NOTE — Assessment & Plan Note (Signed)
Followed by hematology

## 2015-03-25 NOTE — Assessment & Plan Note (Signed)
Followed at pulmonary

## 2015-03-25 NOTE — Progress Notes (Signed)
   BP 101/75 mmHg  Pulse 120  Temp(Src) 97.6 F (36.4 C)  Ht 5' 10.5" (1.791 m)  Wt 158 lb (71.668 kg)  BMI 22.34 kg/m2  SpO2 79%   Subjective:    Patient ID: Charles Barton, male    DOB: Aug 31, 1941, 74 y.o.   MRN: [id number]  HPI: Charles Barton is a 74 y.o. male  Chief Complaint  Patient presents with  . Hypertension  . Hyperlipidemia  . COPD  doing well with meds  Breathing stable No c/o of pain Wt loss is stable Using O2 with no problems \polycythemis is stable no blood draws lately  Relevant past medical, surgical, family and social history reviewed and updated as indicated. Interim medical history since our last visit reviewed. Allergies and medications reviewed and updated.  Review of Systems  Constitutional: Negative.  Negative for fatigue.  Respiratory: Positive for shortness of breath.   Cardiovascular: Negative for chest pain, palpitations and leg swelling.    Per HPI unless specifically indicated above     Objective:    BP 101/75 mmHg  Pulse 120  Temp(Src) 97.6 F (36.4 C)  Ht 5' 10.5" (1.791 m)  Wt 158 lb (71.668 kg)  BMI 22.34 kg/m2  SpO2 79%  Wt Readings from Last 3 Encounters:  03/25/15 158 lb (71.668 kg)  09/22/14 169 lb (76.658 kg)    Physical Exam  Constitutional: He is oriented to person, place, and time. He appears well-developed and well-nourished. No distress.  HENT:  Head: Normocephalic and atraumatic.  Right Ear: Hearing normal.  Left Ear: Hearing normal.  Nose: Nose normal.  Eyes: Conjunctivae and lids are normal. Right eye exhibits no discharge. Left eye exhibits no discharge. No scleral icterus.  Cardiovascular: Normal rate, regular rhythm and normal heart sounds.   Pulmonary/Chest: Effort normal. No respiratory distress.  Poor breath sounds  Musculoskeletal: Normal range of motion.  Neurological: He is alert and oriented to person, place, and time.  Skin: Skin is intact. No rash noted.  Psychiatric: He has a normal mood  and affect. His speech is normal and behavior is normal. Judgment and thought content normal. Cognition and memory are normal.    No results found for this or any previous visit.    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

## 2015-03-25 NOTE — Assessment & Plan Note (Signed)
Had surgery in 2014 and followes surgery q yr and stable

## 2015-04-04 NOTE — Progress Notes (Signed)
[place]  Telephone:(336) [phone] Fax:(336) [phone]  ID: Edilia Bo OB: 03-Jan-1941  MR#: [id number]  BMW#:[id number]  Patient Care Team: Guadalupe Maple, MD as PCP - General (Family Medicine)  CHIEF COMPLAINT:  Chief Complaint  Patient presents with  . Follow-up    polycythemia    INTERVAL HISTORY: Patient returns to clinic today for repeat laboratory work and consideration of additional phlebotomy.  He currently feels well and is at his baseline. He has no neurologic complaints. He denies any recent fevers or illnesses. He is chronically short of breath requiring oxygen 24 hours a day. He denies any chest pain.  He denies any nausea, vomiting, constipation, or diarrhea. He has no urinary complaints. Patient otherwise feels well and offers no further specific complaints.  REVIEW OF SYSTEMS:   Review of Systems  Constitutional: Negative for fever and malaise/fatigue.  Respiratory: Positive for shortness of breath.   Cardiovascular: Negative.   Neurological: Negative for weakness.    As per HPI. Otherwise, a complete review of systems is negatve.  PAST MEDICAL HISTORY: Past Medical History  Diagnosis Date  . Hyperlipidemia   . Hypertension   . COPD (chronic obstructive pulmonary disease)   . Cancer     prostate  . CAD (coronary artery disease)   . Abdominal aneurysm   . Femoral artery aneurysm   . Aortic atherosclerosis     PAST SURGICAL HISTORY: Past Surgical History  Procedure Laterality Date  . Abdominal aortic aneurysm repair  2014  . Femoral artery aneurysm repair  2014    FAMILY HISTORY Family History  Problem Relation Age of Onset  . Heart disease Mother   . Emphysema Father   . Diabetes Paternal Grandmother        ADVANCED DIRECTIVES:    HEALTH MAINTENANCE: History  Substance Use Topics  . Smoking status: Former Smoker    Quit date: 01/01/2007  . Smokeless tobacco: Not on file  . Alcohol Use: No      Colonoscopy:  PAP:  Bone density:  Lipid panel:  Allergies  Allergen Reactions  . No Known Allergies     Current Outpatient Prescriptions  Medication Sig Dispense Refill  . albuterol (PROVENTIL HFA;VENTOLIN HFA) 108 (90 BASE) MCG/ACT inhaler Inhale 2 puffs into the lungs every 6 (six) hours as needed for wheezing or shortness of breath.    Marland Kitchen atenolol (TENORMIN) 25 MG tablet Take 25 mg by mouth daily.    . budesonide-formoterol (SYMBICORT) 80-4.5 MCG/ACT inhaler Inhale 2 puffs into the lungs 2 (two) times daily.    . fluticasone (FLONASE) 50 MCG/ACT nasal spray Place 1 spray into both nostrils daily.    . fluticasone (VERAMYST) 27.5 MCG/SPRAY nasal spray Place 2 sprays into the nose daily.    Marland Kitchen tiotropium (SPIRIVA) 18 MCG inhalation capsule Place 18 mcg into inhaler and inhale daily.     No current facility-administered medications for this visit.    OBJECTIVE: Filed Vitals:   03/12/15 1526  BP: 96/70  Pulse: 75  Temp: 96.6 F (35.9 C)  Resp: 20     Body mass index is 23.18 kg/(m^2).    ECOG FS:0 - Asymptomatic  General: Well-developed, well-nourished, no acute distress. Eyes: anicteric sclera. Lungs: Clear to auscultation bilaterally. Heart: Regular rate and rhythm. No rubs, murmurs, or gallops. Abdomen: Soft, nontender, nondistended. No organomegaly noted, normoactive bowel sounds. Musculoskeletal: No edema, cyanosis, or clubbing. Neuro: Alert, answering all questions appropriately. Cranial nerves grossly intact. Skin: No rashes or petechiae noted.  Psych: Normal affect.   LAB RESULTS:  Lab Results  Component Value Date   NA 144 10/21/2014   K 4.0 10/21/2014   CL 106 10/21/2014   CO2 30 10/21/2014   GLUCOSE 71 10/21/2014   BUN 15 10/21/2014   CREATININE 1.37* 10/21/2014   CALCIUM 8.7 10/21/2014   PROT 5.2* 09/09/2013   ALBUMIN 2.5* 09/09/2013   AST 18 09/09/2013   ALT 28 09/09/2013   ALKPHOS 51 09/09/2013   GFRNONAA >60 02/25/2014   GFRAA >60  02/25/2014    Lab Results  Component Value Date   WBC 9.5 03/12/2015   NEUTROABS 6.0 03/12/2015   HGB 17.5 03/12/2015   HCT 52.8* 03/12/2015   MCV 87.8 03/12/2015   PLT 167 03/12/2015     STUDIES: No results found.  ASSESSMENT: Polycythemia.  PLAN:    1. Polycythemia: Bone Marrow biopsy was essentially normal and did not reveal a distinct etiology for his polycythemia. Previously, his entire polycythemia workup including hemachromatosis mutation, and JAK-2 mutation, JAK-2 exon12 mutation, and BCR-ABL were all negative. Goal hemoglobin will be approximately 17.0-17.5, therefore he does not require phlebotomy today. Can consider adding Hydrea in the future. Return to clinic in 2 months for laboratory work and consideration of phlebotomy and then in 4 months for laboratory work and further evaluation. 2. Hypotension: Improved. Monitor.  Patient expressed understanding and was in agreement with this plan. He also understands that He can call clinic at any time with any questions, concerns, or complaints.    Lloyd Huger, MD   04/04/2015 3:37 PM

## 2015-05-14 ENCOUNTER — Inpatient Hospital Stay: Payer: Commercial Managed Care - HMO | Attending: Oncology

## 2015-05-14 ENCOUNTER — Inpatient Hospital Stay: Payer: Commercial Managed Care - HMO

## 2015-05-14 DIAGNOSIS — D751 Secondary polycythemia: Secondary | ICD-10-CM

## 2015-05-14 DIAGNOSIS — Z79899 Other long term (current) drug therapy: Secondary | ICD-10-CM | POA: Insufficient documentation

## 2015-05-14 DIAGNOSIS — D45 Polycythemia vera: Secondary | ICD-10-CM | POA: Insufficient documentation

## 2015-05-14 LAB — CBC WITH DIFFERENTIAL/PLATELET
Basophils Absolute: 0.1 10*3/uL (ref 0–0.1)
Basophils Relative: 1 %
Eosinophils Absolute: 0.2 10*3/uL (ref 0–0.7)
Eosinophils Relative: 2 %
HCT: 54.8 % — ABNORMAL HIGH (ref 40.0–52.0)
Hemoglobin: 18 g/dL (ref 13.0–18.0)
Lymphocytes Relative: 21 %
Lymphs Abs: 1.7 10*3/uL (ref 1.0–3.6)
MCH: 28.7 pg (ref 26.0–34.0)
MCHC: 32.9 g/dL (ref 32.0–36.0)
MCV: 87.3 fL (ref 80.0–100.0)
Monocytes Absolute: 1.1 10*3/uL — ABNORMAL HIGH (ref 0.2–1.0)
Monocytes Relative: 13 %
Neutro Abs: 5.2 10*3/uL (ref 1.4–6.5)
Neutrophils Relative %: 63 %
Platelets: 144 10*3/uL — ABNORMAL LOW (ref 150–440)
RBC: 6.27 MIL/uL — ABNORMAL HIGH (ref 4.40–5.90)
RDW: 18.3 % — ABNORMAL HIGH (ref 11.5–14.5)
WBC: 8.3 10*3/uL (ref 3.8–10.6)

## 2015-06-24 ENCOUNTER — Ambulatory Visit: Payer: Self-pay

## 2015-06-24 DIAGNOSIS — Z23 Encounter for immunization: Secondary | ICD-10-CM

## 2015-07-16 ENCOUNTER — Inpatient Hospital Stay: Payer: Commercial Managed Care - HMO | Attending: Oncology

## 2015-07-16 ENCOUNTER — Inpatient Hospital Stay: Payer: Commercial Managed Care - HMO

## 2015-07-16 ENCOUNTER — Encounter: Payer: Commercial Managed Care - HMO | Admitting: Oncology

## 2015-07-16 VITALS — BP 114/88 | HR 108 | Temp 97.0°F | Resp 22

## 2015-07-16 DIAGNOSIS — Z79899 Other long term (current) drug therapy: Secondary | ICD-10-CM | POA: Diagnosis not present

## 2015-07-16 DIAGNOSIS — D45 Polycythemia vera: Secondary | ICD-10-CM | POA: Insufficient documentation

## 2015-07-16 DIAGNOSIS — D751 Secondary polycythemia: Secondary | ICD-10-CM

## 2015-07-16 LAB — CBC WITH DIFFERENTIAL/PLATELET
Basophils Absolute: 0.1 10*3/uL (ref 0–0.1)
Basophils Relative: 1 %
EOS ABS: 0.1 10*3/uL (ref 0–0.7)
Eosinophils Relative: 1 %
HCT: 55.7 % — ABNORMAL HIGH (ref 40.0–52.0)
HEMOGLOBIN: 18.2 g/dL — AB (ref 13.0–18.0)
LYMPHS ABS: 1.6 10*3/uL (ref 1.0–3.6)
Lymphocytes Relative: 20 %
MCH: 28.5 pg (ref 26.0–34.0)
MCHC: 32.7 g/dL (ref 32.0–36.0)
MCV: 87.3 fL (ref 80.0–100.0)
MONO ABS: 0.9 10*3/uL (ref 0.2–1.0)
Monocytes Relative: 11 %
NEUTROS PCT: 67 %
Neutro Abs: 5.5 10*3/uL (ref 1.4–6.5)
Platelets: 136 10*3/uL — ABNORMAL LOW (ref 150–440)
RBC: 6.38 MIL/uL — ABNORMAL HIGH (ref 4.40–5.90)
RDW: 18.2 % — AB (ref 11.5–14.5)
WBC: 8.2 10*3/uL (ref 3.8–10.6)

## 2015-07-31 NOTE — Progress Notes (Signed)
  This encounter was created in error - please disregard.

## 2015-08-20 ENCOUNTER — Inpatient Hospital Stay: Payer: Commercial Managed Care - HMO | Attending: Oncology | Admitting: Oncology

## 2015-08-20 ENCOUNTER — Inpatient Hospital Stay: Payer: Commercial Managed Care - HMO

## 2015-08-20 VITALS — BP 79/69 | HR 110 | Temp 96.1°F | Resp 20 | Wt 147.5 lb

## 2015-08-20 DIAGNOSIS — I959 Hypotension, unspecified: Secondary | ICD-10-CM | POA: Diagnosis not present

## 2015-08-20 DIAGNOSIS — I714 Abdominal aortic aneurysm, without rupture: Secondary | ICD-10-CM | POA: Insufficient documentation

## 2015-08-20 DIAGNOSIS — D751 Secondary polycythemia: Secondary | ICD-10-CM

## 2015-08-20 DIAGNOSIS — J449 Chronic obstructive pulmonary disease, unspecified: Secondary | ICD-10-CM | POA: Diagnosis not present

## 2015-08-20 DIAGNOSIS — Z79899 Other long term (current) drug therapy: Secondary | ICD-10-CM

## 2015-08-20 DIAGNOSIS — E785 Hyperlipidemia, unspecified: Secondary | ICD-10-CM | POA: Insufficient documentation

## 2015-08-20 DIAGNOSIS — Z87891 Personal history of nicotine dependence: Secondary | ICD-10-CM | POA: Diagnosis not present

## 2015-08-20 DIAGNOSIS — I10 Essential (primary) hypertension: Secondary | ICD-10-CM | POA: Insufficient documentation

## 2015-08-20 DIAGNOSIS — I2510 Atherosclerotic heart disease of native coronary artery without angina pectoris: Secondary | ICD-10-CM | POA: Diagnosis not present

## 2015-08-20 LAB — CBC WITH DIFFERENTIAL/PLATELET
BASOS ABS: 0.1 10*3/uL (ref 0–0.1)
Basophils Relative: 1 %
EOS ABS: 0.2 10*3/uL (ref 0–0.7)
EOS PCT: 2 %
HCT: 52.9 % — ABNORMAL HIGH (ref 40.0–52.0)
HEMOGLOBIN: 17.3 g/dL (ref 13.0–18.0)
LYMPHS ABS: 1.4 10*3/uL (ref 1.0–3.6)
Lymphocytes Relative: 19 %
MCH: 28.6 pg (ref 26.0–34.0)
MCHC: 32.8 g/dL (ref 32.0–36.0)
MCV: 87.2 fL (ref 80.0–100.0)
Monocytes Absolute: 0.9 10*3/uL (ref 0.2–1.0)
Monocytes Relative: 12 %
NEUTROS PCT: 66 %
Neutro Abs: 4.7 10*3/uL (ref 1.4–6.5)
PLATELETS: 135 10*3/uL — AB (ref 150–440)
RBC: 6.06 MIL/uL — AB (ref 4.40–5.90)
RDW: 19.5 % — ABNORMAL HIGH (ref 11.5–14.5)
WBC: 7.2 10*3/uL (ref 3.8–10.6)

## 2015-09-04 NOTE — Progress Notes (Signed)
[name]  [phone] Fax:(336) [phone]  ID: Charles Barton OB: 12/27/1940  MR#: [id number]  TTS#:[id number]  Patient Care Team: Guadalupe Maple, MD as PCP - General (Family Medicine) Guadalupe Maple, MD (Family Medicine) Allyne Gee, MD as Consulting Physician (Internal Medicine) Lloyd Huger, MD as Consulting Physician (Oncology)  CHIEF COMPLAINT:  Chief Complaint  Patient presents with  . polycythemia    INTERVAL HISTORY: Patient returns to clinic today for repeat laboratory work and consideration of additional phlebotomy.  He currently feels well and is at his baseline. He has no neurologic complaints. He denies any recent fevers or illnesses. He is chronically short of breath requiring oxygen 24 hours a day. He denies any chest pain.  He denies any nausea, vomiting, constipation, or diarrhea. He has no urinary complaints. Patient offers no further specific complaints today.  REVIEW OF SYSTEMS:   Review of Systems  Constitutional: Negative for fever and malaise/fatigue.  Respiratory: Positive for shortness of breath.   Cardiovascular: Negative.   Neurological: Negative for weakness.    As per HPI. Otherwise, a complete review of systems is negatve.  PAST MEDICAL HISTORY: Past Medical History  Diagnosis Date  . Abdominal aneurysm   . Femoral artery aneurysm   . Cancer     prostate  . Hypertension   . Hyperlipidemia   . CAD (coronary artery disease)   . COPD (chronic obstructive pulmonary disease)   . AAA (abdominal aortic aneurysm)   . Aneurysm of artery of lower extremity   . Aortic atherosclerosis     PAST SURGICAL HISTORY: Past Surgical History  Procedure Laterality Date  . Abdominal aortic aneurysm repair  2014  . Femoral artery aneurysm repair  2014  . Femoral artery aneurysm repair    . Abdominal aortic aneurysm repair      FAMILY HISTORY Family History  Problem Relation Age of Onset  . Heart disease  Mother   . Emphysema Father   . Diabetes Paternal Grandmother        ADVANCED DIRECTIVES:    HEALTH MAINTENANCE: Social History  Substance Use Topics  . Smoking status: Former Smoker    Quit date: 01/01/2007  . Smokeless tobacco: Never Used  . Alcohol Use: No     Colonoscopy:  PAP:  Bone density:  Lipid panel:  Allergies  Allergen Reactions  . No Known Allergies     Current Outpatient Prescriptions  Medication Sig Dispense Refill  . albuterol (ACCUNEB) 0.63 MG/3ML nebulizer solution Take 1 ampule by nebulization every 6 (six) hours as needed for wheezing.    Marland Kitchen albuterol (PROVENTIL HFA;VENTOLIN HFA) 108 (90 BASE) MCG/ACT inhaler Inhale 2 puffs into the lungs every 6 (six) hours as needed for wheezing or shortness of breath.    Marland Kitchen atenolol (TENORMIN) 25 MG tablet Take 25 mg by mouth daily.    . budesonide-formoterol (SYMBICORT) 80-4.5 MCG/ACT inhaler Inhale 2 puffs into the lungs 2 (two) times daily.    . fluticasone (FLONASE) 50 MCG/ACT nasal spray Place 1 spray into both nostrils daily. 3 g 4  . tiotropium (SPIRIVA) 18 MCG inhalation capsule Place 18 mcg into inhaler and inhale daily.     No current facility-administered medications for this visit.    OBJECTIVE: Filed Vitals:   08/20/15 1112  BP: 79/69  Pulse: 110  Temp: 96.1 F (35.6 C)  Resp: 20     Body mass index is 20.86 kg/(m^2).    ECOG FS:0 - Asymptomatic  General:  Well-developed, well-nourished, no acute distress. Eyes: anicteric sclera. Lungs: Clear to auscultation bilaterally. Heart: Regular rate and rhythm. No rubs, murmurs, or gallops. Abdomen: Soft, nontender, nondistended. No organomegaly noted, normoactive bowel sounds. Musculoskeletal: No edema, cyanosis, or clubbing. Neuro: Alert, answering all questions appropriately. Cranial nerves grossly intact. Skin: No rashes or petechiae noted. Psych: Normal affect.   LAB RESULTS:  Lab Results  Component Value Date   NA 144 10/21/2014   K 4.0  10/21/2014   CL 106 10/21/2014   CO2 30 10/21/2014   GLUCOSE 71 10/21/2014   BUN 15 10/21/2014   CREATININE 1.37* 10/21/2014   CALCIUM 8.7 10/21/2014   PROT 5.2* 09/09/2013   ALBUMIN 2.5* 09/09/2013   AST 18 09/09/2013   ALT 28 09/09/2013   ALKPHOS 51 09/09/2013   BILITOT 0.9 09/09/2013   GFRNONAA 54* 10/21/2014   GFRAA >60 10/21/2014    Lab Results  Component Value Date   WBC 7.2 08/20/2015   NEUTROABS 4.7 08/20/2015   HGB 17.3 08/20/2015   HCT 52.9* 08/20/2015   MCV 87.2 08/20/2015   PLT 135* 08/20/2015     STUDIES: No results found.  ASSESSMENT: Polycythemia.  PLAN:    1. Polycythemia: Bone marrow biopsy was essentially normal and did not reveal a distinct etiology for his polycythemia. Previously, his entire polycythemia workup including hemachromatosis mutation, and JAK-2 mutation, JAK-2 exon12 mutation, and BCR-ABL were all negative. Goal hemoglobin will be approximately 17.0-17.5, therefore he does not require phlebotomy today. Can consider adding Hydrea in the future. Return to clinic in 2 months for laboratory work and consideration of phlebotomy and then in 4 months for laboratory work and further evaluation. 2. Hypotension: Patient's blood pressure continues to be low, but he is asymptomatic. Monitor. If he has phlebotomy was in the future, he will also require 1 L of IV fluids replacement.  Patient expressed understanding and was in agreement with this plan. He also understands that He can call clinic at any time with any questions, concerns, or complaints.    Lloyd Huger, MD   09/04/2015 4:06 PM

## 2015-10-11 ENCOUNTER — Encounter: Payer: Commercial Managed Care - HMO | Admitting: Family Medicine

## 2015-10-22 ENCOUNTER — Inpatient Hospital Stay: Payer: Commercial Managed Care - HMO | Attending: Oncology

## 2015-10-22 ENCOUNTER — Inpatient Hospital Stay: Payer: Commercial Managed Care - HMO

## 2015-10-22 ENCOUNTER — Ambulatory Visit: Payer: Self-pay

## 2015-10-22 DIAGNOSIS — Z87891 Personal history of nicotine dependence: Secondary | ICD-10-CM | POA: Diagnosis not present

## 2015-10-22 DIAGNOSIS — J449 Chronic obstructive pulmonary disease, unspecified: Secondary | ICD-10-CM | POA: Diagnosis not present

## 2015-10-22 DIAGNOSIS — D751 Secondary polycythemia: Secondary | ICD-10-CM | POA: Diagnosis present

## 2015-10-22 LAB — CBC WITH DIFFERENTIAL/PLATELET
BASOS ABS: 0.1 10*3/uL (ref 0–0.1)
Basophils Relative: 2 %
EOS ABS: 0.2 10*3/uL (ref 0–0.7)
Eosinophils Relative: 3 %
HCT: 50.2 % (ref 40.0–52.0)
Hemoglobin: 16.1 g/dL (ref 13.0–18.0)
Lymphocytes Relative: 19 %
Lymphs Abs: 1.2 10*3/uL (ref 1.0–3.6)
MCH: 27.7 pg (ref 26.0–34.0)
MCHC: 32.1 g/dL (ref 32.0–36.0)
MCV: 86.3 fL (ref 80.0–100.0)
Monocytes Absolute: 0.6 10*3/uL (ref 0.2–1.0)
Monocytes Relative: 9 %
Neutro Abs: 4.3 10*3/uL (ref 1.4–6.5)
Neutrophils Relative %: 67 %
PLATELETS: 130 10*3/uL — AB (ref 150–440)
RBC: 5.81 MIL/uL (ref 4.40–5.90)
RDW: 18.8 % — ABNORMAL HIGH (ref 11.5–14.5)
WBC: 6.4 10*3/uL (ref 3.8–10.6)

## 2015-11-04 ENCOUNTER — Ambulatory Visit (INDEPENDENT_AMBULATORY_CARE_PROVIDER_SITE_OTHER): Payer: Commercial Managed Care - HMO | Admitting: Family Medicine

## 2015-11-04 ENCOUNTER — Encounter: Payer: Self-pay | Admitting: Family Medicine

## 2015-11-04 VITALS — BP 112/80 | HR 109 | Temp 97.5°F | Ht 69.8 in | Wt 146.0 lb

## 2015-11-04 DIAGNOSIS — I272 Other secondary pulmonary hypertension: Secondary | ICD-10-CM

## 2015-11-04 DIAGNOSIS — I10 Essential (primary) hypertension: Secondary | ICD-10-CM | POA: Diagnosis not present

## 2015-11-04 DIAGNOSIS — J449 Chronic obstructive pulmonary disease, unspecified: Secondary | ICD-10-CM

## 2015-11-04 DIAGNOSIS — D751 Secondary polycythemia: Secondary | ICD-10-CM | POA: Diagnosis not present

## 2015-11-04 DIAGNOSIS — I714 Abdominal aortic aneurysm, without rupture, unspecified: Secondary | ICD-10-CM

## 2015-11-04 DIAGNOSIS — Z0000 Encounter for general adult medical examination without abnormal findings: Secondary | ICD-10-CM

## 2015-11-04 DIAGNOSIS — I2723 Pulmonary hypertension due to lung diseases and hypoxia: Secondary | ICD-10-CM

## 2015-11-04 LAB — URINALYSIS, ROUTINE W REFLEX MICROSCOPIC
Bilirubin, UA: NEGATIVE
GLUCOSE, UA: NEGATIVE
KETONES UA: NEGATIVE
Leukocytes, UA: NEGATIVE
NITRITE UA: NEGATIVE
RBC, UA: NEGATIVE
Specific Gravity, UA: 1.025 (ref 1.005–1.030)
UUROB: 0.2 mg/dL (ref 0.2–1.0)
pH, UA: 6 (ref 5.0–7.5)

## 2015-11-04 LAB — MICROSCOPIC EXAMINATION

## 2015-11-04 NOTE — Assessment & Plan Note (Signed)
The current medical regimen is effective;  continue present plan and medications.  

## 2015-11-04 NOTE — Addendum Note (Signed)
Addended byGolden Pop on: 11/04/2015 10:57 AM   Modules accepted: Orders, SmartSet

## 2015-11-04 NOTE — Assessment & Plan Note (Signed)
Followed by pulmonary 

## 2015-11-04 NOTE — Progress Notes (Addendum)
BP 112/80 mmHg  Pulse 109  Temp(Src) 97.5 F (36.4 C)  Ht 5' 9.8" (1.773 m)  Wt 146 lb (66.225 kg)  BMI 21.07 kg/m2  SpO2 84%   Subjective:    Patient ID: Charles Barton, male    DOB: 1941/01/27, 75 y.o.   MRN: [id number]  HPI: Charles Barton is a 75 y.o. male  Chief Complaint  Patient presents with  . Annual Exam   patient followed by pulmonary for severe COPD and is now under treatment for pulmonary hypertension with new medication which is really improved his stamina and strength over 20% and patient feeling much better. Polycythemia followed by oncology and medicine doing well last visit no blood was needed to be drawn as new medicine for pulmonary hypertension has reduced blood. CBC Continues to Be Monitored Aneurysms has sonogram scheduled in March foot drop has resolved  Relevant past medical, surgical, family and social history reviewed and updated as indicated. Interim medical history since our last visit reviewed. Allergies and medications reviewed and updated.  Other than nted above Review of Systems  Constitutional: Negative.   HENT: Negative.   Eyes: Negative.   Respiratory: Negative.   Cardiovascular: Negative.   Gastrointestinal: Negative.   Endocrine: Negative.   Genitourinary: Negative.   Musculoskeletal: Negative.   Skin: Negative.   Allergic/Immunologic: Negative.   Neurological: Negative.   Hematological: Negative.   Psychiatric/Behavioral: Negative.     Per HPI unless specifically indicated above     Objective:    BP 112/80 mmHg  Pulse 109  Temp(Src) 97.5 F (36.4 C)  Ht 5' 9.8" (1.773 m)  Wt 146 lb (66.225 kg)  BMI 21.07 kg/m2  SpO2 84%  Wt Readings from Last 3 Encounters:  11/04/15 146 lb (66.225 kg)  08/20/15 147 lb 7.8 oz (66.9 kg)  03/25/15 158 lb (71.668 kg)    Physical Exam  Constitutional: He is oriented to person, place, and time. He appears well-developed and well-nourished.  HENT:  Head: Normocephalic and  atraumatic.  Right Ear: External ear normal.  Left Ear: External ear normal.  Eyes: Conjunctivae and EOM are normal. Pupils are equal, round, and reactive to light.  Neck: Normal range of motion. Neck supple.  Cardiovascular: Normal rate, regular rhythm, normal heart sounds and intact distal pulses.   Pulmonary/Chest: Effort normal and breath sounds normal.  Abdominal: Soft. Bowel sounds are normal. There is no splenomegaly or hepatomegaly.  Genitourinary:  Refused by pt  Musculoskeletal: Normal range of motion.  Neurological: He is alert and oriented to person, place, and time. He has normal reflexes.  Skin: No rash noted. No erythema.  Psychiatric: He has a normal mood and affect. His behavior is normal. Judgment and thought content normal.    Results for orders placed or performed in visit on 10/22/15  CBC with Differential/Platelet  Result Value Ref Range   WBC 6.4 3.8 - 10.6 K/uL   RBC 5.81 4.40 - 5.90 MIL/uL   Hemoglobin 16.1 13.0 - 18.0 g/dL   HCT 50.2 40.0 - 52.0 %   MCV 86.3 80.0 - 100.0 fL   MCH 27.7 26.0 - 34.0 pg   MCHC 32.1 32.0 - 36.0 g/dL   RDW 18.8 (H) 11.5 - 14.5 %   Platelets 130 (L) 150 - 440 K/uL   Neutrophils Relative % 67 %   Neutro Abs 4.3 1.4 - 6.5 K/uL   Lymphocytes Relative 19 %   Lymphs Abs 1.2 1.0 - 3.6 K/uL   Monocytes  Relative 9 %   Monocytes Absolute 0.6 0.2 - 1.0 K/uL   Eosinophils Relative 3 %   Eosinophils Absolute 0.2 0 - 0.7 K/uL   Basophils Relative 2 %   Basophils Absolute 0.1 0 - 0.1 K/uL      Assessment & Plan:   Problem List Items Addressed This Visit      Cardiovascular and Mediastinum   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       Relevant Medications   OPSUMIT 10 MG TABS   Other Relevant Orders   Comprehensive metabolic panel   Lipid panel   CBC with Differential/Platelet   TSH   Urinalysis, Routine w reflex microscopic (not at [facility])   AAA (abdominal aortic aneurysm) ([place])    Has ultrasound scheduled next month      Relevant Medications   OPSUMIT 10 MG TABS   Other Relevant Orders   Lipid panel   CBC with Differential/Platelet   TSH   Urinalysis, Routine w reflex microscopic (not at [facility])     Respiratory   COPD (chronic obstructive pulmonary disease) ([place])    Followed by pulmonary      Relevant Orders   Lipid panel   CBC with Differential/Platelet   TSH   Urinalysis, Routine w reflex microscopic (not at [facility])     Other   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

## 2015-11-04 NOTE — Assessment & Plan Note (Signed)
Followed by pulmonary and stable Medications

## 2015-11-04 NOTE — Assessment & Plan Note (Signed)
Followed by oncology and stable

## 2015-11-04 NOTE — Assessment & Plan Note (Signed)
Has ultrasound scheduled next month

## 2015-11-05 LAB — COMPREHENSIVE METABOLIC PANEL
A/G RATIO: 1.5 (ref 1.1–2.5)
ALT: 11 IU/L (ref 0–44)
AST: 16 IU/L (ref 0–40)
Albumin: 4.3 g/dL (ref 3.5–4.8)
Alkaline Phosphatase: 74 IU/L (ref 39–117)
BILIRUBIN TOTAL: 1.3 mg/dL — AB (ref 0.0–1.2)
BUN/Creatinine Ratio: 16 (ref 10–22)
BUN: 21 mg/dL (ref 8–27)
CHLORIDE: 102 mmol/L (ref 96–106)
CO2: 19 mmol/L (ref 18–29)
Calcium: 9.2 mg/dL (ref 8.6–10.2)
Creatinine, Ser: 1.34 mg/dL — ABNORMAL HIGH (ref 0.76–1.27)
GFR calc Af Amer: 60 mL/min/{1.73_m2} (ref >59)
GFR calc non Af Amer: 52 mL/min/{1.73_m2} — ABNORMAL LOW (ref >59)
GLOBULIN, TOTAL: 2.9 g/dL (ref 1.5–4.5)
Glucose: 90 mg/dL (ref 65–99)
POTASSIUM: 4.3 mmol/L (ref 3.5–5.2)
SODIUM: 143 mmol/L (ref 134–144)
Total Protein: 7.2 g/dL (ref 6.0–8.5)

## 2015-11-05 LAB — CBC WITH DIFFERENTIAL/PLATELET
BASOS ABS: 0.1 10*3/uL (ref 0.0–0.2)
Basos: 1 %
EOS (ABSOLUTE): 0.2 10*3/uL (ref 0.0–0.4)
EOS: 3 %
HEMOGLOBIN: 16.6 g/dL (ref 12.6–17.7)
Hematocrit: 51 % (ref 37.5–51.0)
IMMATURE GRANS (ABS): 0 10*3/uL (ref 0.0–0.1)
IMMATURE GRANULOCYTES: 0 %
LYMPHS: 18 %
Lymphocytes Absolute: 1.4 10*3/uL (ref 0.7–3.1)
MCH: 27.7 pg (ref 26.6–33.0)
MCHC: 32.5 g/dL (ref 31.5–35.7)
MCV: 85 fL (ref 79–97)
MONOCYTES: 12 %
Monocytes Absolute: 0.9 10*3/uL (ref 0.1–0.9)
Neutrophils Absolute: 5.1 10*3/uL (ref 1.4–7.0)
Neutrophils: 66 %
Platelets: 154 10*3/uL (ref 150–379)
RBC: 5.99 x10E6/uL — AB (ref 4.14–5.80)
RDW: 18.6 % — ABNORMAL HIGH (ref 12.3–15.4)
WBC: 7.6 10*3/uL (ref 3.4–10.8)

## 2015-11-05 LAB — TSH: TSH: 3.72 u[IU]/mL (ref 0.450–4.500)

## 2015-11-05 LAB — LIPID PANEL
CHOL/HDL RATIO: 2.5 ratio (ref 0.0–5.0)
Cholesterol, Total: 133 mg/dL (ref 100–199)
HDL: 54 mg/dL (ref >39)
LDL Calculated: 66 mg/dL (ref 0–99)
Triglycerides: 64 mg/dL (ref 0–149)
VLDL Cholesterol Cal: 13 mg/dL (ref 5–40)

## 2015-11-06 ENCOUNTER — Encounter: Payer: Self-pay | Admitting: Family Medicine

## 2015-12-06 ENCOUNTER — Other Ambulatory Visit: Payer: Self-pay | Admitting: Physician Assistant

## 2015-12-06 DIAGNOSIS — I279 Pulmonary heart disease, unspecified: Secondary | ICD-10-CM

## 2015-12-16 ENCOUNTER — Ambulatory Visit
Admission: RE | Admit: 2015-12-16 | Discharge: 2015-12-16 | Disposition: A | Discharge status: Home / Self Care | Payer: Commercial Managed Care - HMO | Source: Ambulatory Visit | Attending: Physician Assistant | Admitting: Physician Assistant

## 2015-12-16 ENCOUNTER — Other Ambulatory Visit: Payer: Self-pay | Admitting: *Deleted

## 2015-12-16 DIAGNOSIS — I279 Pulmonary heart disease, unspecified: Secondary | ICD-10-CM | POA: Insufficient documentation

## 2015-12-16 DIAGNOSIS — I10 Essential (primary) hypertension: Secondary | ICD-10-CM

## 2015-12-16 DIAGNOSIS — D751 Secondary polycythemia: Secondary | ICD-10-CM

## 2015-12-16 NOTE — Progress Notes (Signed)
*  PRELIMINARY RESULTS* Echocardiogram 2D Echocardiogram has been performed.  Laqueta Jean Hege 12/16/2015, 10:34 AM

## 2015-12-17 ENCOUNTER — Inpatient Hospital Stay (HOSPITAL_BASED_OUTPATIENT_CLINIC_OR_DEPARTMENT_OTHER): Payer: Commercial Managed Care - HMO | Admitting: Oncology

## 2015-12-17 ENCOUNTER — Inpatient Hospital Stay: Payer: Commercial Managed Care - HMO | Attending: Oncology

## 2015-12-17 ENCOUNTER — Inpatient Hospital Stay: Payer: Commercial Managed Care - HMO

## 2015-12-17 VITALS — BP 99/69 | HR 106 | Temp 95.8°F | Wt 149.9 lb

## 2015-12-17 DIAGNOSIS — I10 Essential (primary) hypertension: Secondary | ICD-10-CM | POA: Insufficient documentation

## 2015-12-17 DIAGNOSIS — R0602 Shortness of breath: Secondary | ICD-10-CM | POA: Diagnosis not present

## 2015-12-17 DIAGNOSIS — E785 Hyperlipidemia, unspecified: Secondary | ICD-10-CM | POA: Diagnosis not present

## 2015-12-17 DIAGNOSIS — Z8679 Personal history of other diseases of the circulatory system: Secondary | ICD-10-CM | POA: Diagnosis not present

## 2015-12-17 DIAGNOSIS — J449 Chronic obstructive pulmonary disease, unspecified: Secondary | ICD-10-CM | POA: Insufficient documentation

## 2015-12-17 DIAGNOSIS — Z9981 Dependence on supplemental oxygen: Secondary | ICD-10-CM

## 2015-12-17 DIAGNOSIS — I959 Hypotension, unspecified: Secondary | ICD-10-CM | POA: Diagnosis not present

## 2015-12-17 DIAGNOSIS — I2510 Atherosclerotic heart disease of native coronary artery without angina pectoris: Secondary | ICD-10-CM | POA: Diagnosis not present

## 2015-12-17 DIAGNOSIS — Z79899 Other long term (current) drug therapy: Secondary | ICD-10-CM | POA: Insufficient documentation

## 2015-12-17 DIAGNOSIS — Z87891 Personal history of nicotine dependence: Secondary | ICD-10-CM | POA: Insufficient documentation

## 2015-12-17 DIAGNOSIS — D751 Secondary polycythemia: Secondary | ICD-10-CM | POA: Diagnosis not present

## 2015-12-17 LAB — CBC WITH DIFFERENTIAL/PLATELET
Basophils Absolute: 0.2 10*3/uL — ABNORMAL HIGH (ref 0–0.1)
Basophils Relative: 2 %
EOS ABS: 0.1 10*3/uL (ref 0–0.7)
EOS PCT: 1 %
HCT: 51 % (ref 40.0–52.0)
Hemoglobin: 16.2 g/dL (ref 13.0–18.0)
LYMPHS ABS: 1 10*3/uL (ref 1.0–3.6)
LYMPHS PCT: 12 %
MCH: 27.1 pg (ref 26.0–34.0)
MCHC: 31.8 g/dL — ABNORMAL LOW (ref 32.0–36.0)
MCV: 85.3 fL (ref 80.0–100.0)
MONO ABS: 1 10*3/uL (ref 0.2–1.0)
Monocytes Relative: 11 %
Neutro Abs: 6.4 10*3/uL (ref 1.4–6.5)
Neutrophils Relative %: 74 %
PLATELETS: 123 10*3/uL — AB (ref 150–440)
RBC: 5.97 MIL/uL — ABNORMAL HIGH (ref 4.40–5.90)
RDW: 19.3 % — AB (ref 11.5–14.5)
WBC: 8.7 10*3/uL (ref 3.8–10.6)

## 2016-01-02 NOTE — Progress Notes (Signed)
[place]  [phone]) [phone] Fax:(336) [phone]  ID: Charles Barton OB: 1941/06/06  MR#: [id number]  UYQ#:[id number]  Patient Care Team: Guadalupe Maple, MD as PCP - General (Family Medicine) Guadalupe Maple, MD (Family Medicine) Allyne Gee, MD as Consulting Physician (Internal Medicine) Lloyd Huger, MD as Consulting Physician (Oncology)  CHIEF COMPLAINT:  Chief Complaint  Patient presents with  . Follow-up    Polycythemia    INTERVAL HISTORY: Patient returns to clinic today for repeat laboratory work and consideration of additional phlebotomy.  He currently feels well and is at his baseline. He has no neurologic complaints. He denies any recent fevers or illnesses. He is chronically short of breath requiring oxygen 24 hours a day which is unchanged. He denies any chest pain.  He denies any nausea, vomiting, constipation, or diarrhea. He has no urinary complaints. Patient offers no further specific complaints today.  REVIEW OF SYSTEMS:   Review of Systems  Constitutional: Negative for fever and malaise/fatigue.  Respiratory: Positive for shortness of breath.   Cardiovascular: Negative.   Gastrointestinal: Negative.   Musculoskeletal: Negative.   Neurological: Negative.  Negative for weakness.    As per HPI. Otherwise, a complete review of systems is negatve.  PAST MEDICAL HISTORY: Past Medical History  Diagnosis Date  . Abdominal aneurysm ([place])   . 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 Cancer [facility])     prostate  . Hypertension   . Hyperlipidemia   . CAD (coronary artery disease)   . COPD (chronic obstructive pulmonary disease) ([place])   . AAA (abdominal aortic aneurysm) ([place])   . 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 Aortic atherosclerosis ([place])     PAST SURGICAL HISTORY: Past Surgical History  Procedure Laterality Date  . Abdominal aortic aneurysm repair  2014  . Femoral artery aneurysm repair  2014  . Femoral  artery aneurysm repair    . Abdominal aortic aneurysm repair      FAMILY HISTORY Family History  Problem Relation Age of Onset  . Heart disease Mother   . Emphysema Father   . Diabetes Paternal Grandmother        ADVANCED DIRECTIVES:    HEALTH MAINTENANCE: Social History  Substance Use Topics  . Smoking status: Former Smoker    Quit date: 01/01/2007  . Smokeless tobacco: Never Used  . Alcohol Use: No     Colonoscopy:  PAP:  Bone density:  Lipid panel:  Allergies  Allergen Reactions  . No Known Allergies     Current Outpatient Prescriptions  Medication Sig Dispense Refill  . albuterol (ACCUNEB) 0.63 MG/3ML nebulizer solution Take 1 ampule by nebulization every 6 (six) hours as needed for wheezing.    Marland Kitchen albuterol (PROVENTIL HFA;VENTOLIN HFA) 108 (90 BASE) MCG/ACT inhaler Inhale 2 puffs into the lungs every 6 (six) hours as needed for wheezing or shortness of breath.    . budesonide-formoterol (SYMBICORT) 80-4.5 MCG/ACT inhaler Inhale 2 puffs into the lungs 2 (two) times daily.    . fluticasone (FLONASE) 50 MCG/ACT nasal spray Place 1 spray into both nostrils daily. 3 g 4  . OPSUMIT 10 MG TABS 10 mg daily.    Marland Kitchen tiotropium (SPIRIVA) 18 MCG inhalation capsule Place 18 mcg into inhaler and inhale daily.     No current facility-administered medications for this visit.    OBJECTIVE: Filed Vitals:   12/17/15 1010  BP: 99/69  Pulse: 106  Temp: 95.8 F (35.4 C)  Body mass index is 21.63 kg/(m^2).    ECOG FS:0 - Asymptomatic  General: Well-developed, well-nourished, no acute distress. Eyes: anicteric sclera. Lungs: Clear to auscultation bilaterally. Heart: Regular rate and rhythm. No rubs, murmurs, or gallops. Abdomen: Soft, nontender, nondistended. No organomegaly noted, normoactive bowel sounds. Musculoskeletal: No edema, cyanosis, or clubbing. Neuro: Alert, answering all questions appropriately. Cranial nerves grossly intact. Skin: No rashes or petechiae  noted. Psych: Normal affect.   LAB RESULTS:  Lab Results  Component Value Date   NA 143 11/04/2015   K 4.3 11/04/2015   CL 102 11/04/2015   CO2 19 11/04/2015   GLUCOSE 90 11/04/2015   BUN 21 11/04/2015   CREATININE 1.34* 11/04/2015   CALCIUM 9.2 11/04/2015   PROT 7.2 11/04/2015   ALBUMIN 4.3 11/04/2015   AST 16 11/04/2015   ALT 11 11/04/2015   ALKPHOS 74 11/04/2015   BILITOT 1.3* 11/04/2015   GFRNONAA 52* 11/04/2015   GFRAA 60 11/04/2015    Lab Results  Component Value Date   WBC 8.7 12/17/2015   NEUTROABS 6.4 12/17/2015   HGB 16.2 12/17/2015   HCT 51.0 12/17/2015   MCV 85.3 12/17/2015   PLT 123* 12/17/2015     STUDIES: No results found.  ASSESSMENT: Polycythemia.  PLAN:    1. Polycythemia: Bone marrow biopsy was essentially normal and did not reveal a distinct etiology for his polycythemia. Previously, his entire polycythemia workup including hemachromatosis mutation, and JAK-2 mutation, JAK-2 exon12 mutation, and BCR-ABL were all negative. Goal hemoglobin will be approximately 17.0-17.5, therefore he does not require phlebotomy today. After discussion with the patient, he has requested no further follow-up with continued monitoring of his hemoglobin by his primary care physician. If his hemoglobin trends above 17.0, please refer him back for further evaluation and consideration of additional phlebotomy. 2. Hypotension: Patient's blood pressure continues to be low, but he is asymptomatic. Monitor. If he has phlebotomy in the future, he will also require 1 L of IV fluids replacement.  Patient expressed understanding and was in agreement with this plan. He also understands that He can call clinic at any time with any questions, concerns, or complaints.    Lloyd Huger, MD   01/02/2016 8:05 AM

## 2016-01-23 ENCOUNTER — Emergency Department: Payer: Commercial Managed Care - HMO

## 2016-01-23 ENCOUNTER — Inpatient Hospital Stay
Admission: EM | Admit: 2016-01-23 | Discharge: 2016-03-02 | DRG: 190 | Disposition: E | Payer: Commercial Managed Care - HMO | Attending: Internal Medicine | Admitting: Internal Medicine

## 2016-01-23 ENCOUNTER — Encounter: Payer: Self-pay | Admitting: Emergency Medicine

## 2016-01-23 DIAGNOSIS — Z6823 Body mass index (BMI) 23.0-23.9, adult: Secondary | ICD-10-CM | POA: Diagnosis not present

## 2016-01-23 DIAGNOSIS — Z8249 Family history of ischemic heart disease and other diseases of the circulatory system: Secondary | ICD-10-CM | POA: Diagnosis not present

## 2016-01-23 DIAGNOSIS — J181 Lobar pneumonia, unspecified organism: Secondary | ICD-10-CM | POA: Diagnosis present

## 2016-01-23 DIAGNOSIS — Z8679 Personal history of other diseases of the circulatory system: Secondary | ICD-10-CM

## 2016-01-23 DIAGNOSIS — J9622 Acute and chronic respiratory failure with hypercapnia: Secondary | ICD-10-CM | POA: Diagnosis not present

## 2016-01-23 DIAGNOSIS — J9621 Acute and chronic respiratory failure with hypoxia: Secondary | ICD-10-CM | POA: Diagnosis not present

## 2016-01-23 DIAGNOSIS — Z9981 Dependence on supplemental oxygen: Secondary | ICD-10-CM | POA: Diagnosis not present

## 2016-01-23 DIAGNOSIS — E43 Unspecified severe protein-calorie malnutrition: Secondary | ICD-10-CM | POA: Diagnosis present

## 2016-01-23 DIAGNOSIS — Z825 Family history of asthma and other chronic lower respiratory diseases: Secondary | ICD-10-CM | POA: Diagnosis not present

## 2016-01-23 DIAGNOSIS — J189 Pneumonia, unspecified organism: Secondary | ICD-10-CM | POA: Diagnosis not present

## 2016-01-23 DIAGNOSIS — Z66 Do not resuscitate: Secondary | ICD-10-CM | POA: Diagnosis present

## 2016-01-23 DIAGNOSIS — Z833 Family history of diabetes mellitus: Secondary | ICD-10-CM

## 2016-01-23 DIAGNOSIS — E785 Hyperlipidemia, unspecified: Secondary | ICD-10-CM | POA: Diagnosis present

## 2016-01-23 DIAGNOSIS — J438 Other emphysema: Secondary | ICD-10-CM | POA: Diagnosis not present

## 2016-01-23 DIAGNOSIS — J441 Chronic obstructive pulmonary disease with (acute) exacerbation: Secondary | ICD-10-CM | POA: Diagnosis present

## 2016-01-23 DIAGNOSIS — Z8546 Personal history of malignant neoplasm of prostate: Secondary | ICD-10-CM

## 2016-01-23 DIAGNOSIS — Z9889 Other specified postprocedural states: Secondary | ICD-10-CM

## 2016-01-23 DIAGNOSIS — F419 Anxiety disorder, unspecified: Secondary | ICD-10-CM | POA: Diagnosis present

## 2016-01-23 DIAGNOSIS — J439 Emphysema, unspecified: Secondary | ICD-10-CM | POA: Diagnosis not present

## 2016-01-23 DIAGNOSIS — I2510 Atherosclerotic heart disease of native coronary artery without angina pectoris: Secondary | ICD-10-CM | POA: Diagnosis present

## 2016-01-23 DIAGNOSIS — R0602 Shortness of breath: Secondary | ICD-10-CM

## 2016-01-23 DIAGNOSIS — J9601 Acute respiratory failure with hypoxia: Secondary | ICD-10-CM

## 2016-01-23 DIAGNOSIS — Z515 Encounter for palliative care: Secondary | ICD-10-CM

## 2016-01-23 DIAGNOSIS — J9690 Respiratory failure, unspecified, unspecified whether with hypoxia or hypercapnia: Secondary | ICD-10-CM | POA: Diagnosis present

## 2016-01-23 DIAGNOSIS — Z87891 Personal history of nicotine dependence: Secondary | ICD-10-CM | POA: Diagnosis not present

## 2016-01-23 DIAGNOSIS — J440 Chronic obstructive pulmonary disease with acute lower respiratory infection: Secondary | ICD-10-CM | POA: Diagnosis not present

## 2016-01-23 DIAGNOSIS — R0902 Hypoxemia: Secondary | ICD-10-CM | POA: Insufficient documentation

## 2016-01-23 DIAGNOSIS — R079 Chest pain, unspecified: Secondary | ICD-10-CM

## 2016-01-23 DIAGNOSIS — I272 Other secondary pulmonary hypertension: Secondary | ICD-10-CM | POA: Diagnosis not present

## 2016-01-23 DIAGNOSIS — K219 Gastro-esophageal reflux disease without esophagitis: Secondary | ICD-10-CM | POA: Diagnosis present

## 2016-01-23 DIAGNOSIS — I10 Essential (primary) hypertension: Secondary | ICD-10-CM | POA: Diagnosis present

## 2016-01-23 LAB — BLOOD GAS, ARTERIAL
Acid-base deficit: 5.9 mmol/L — ABNORMAL HIGH (ref 0.0–2.0)
Allens test (pass/fail): POSITIVE — AB
BICARBONATE: 17.6 meq/L — AB (ref 21.0–28.0)
Delivery systems: POSITIVE
EXPIRATORY PAP: 5
FIO2: 1
Inspiratory PAP: 10
O2 SAT: 93.1 %
PCO2 ART: 29 mmHg — AB (ref 32.0–48.0)
PO2 ART: 68 mmHg — AB (ref 83.0–108.0)
Patient temperature: 37
pH, Arterial: 7.39 (ref 7.350–7.450)

## 2016-01-23 LAB — MRSA PCR SCREENING: MRSA by PCR: NEGATIVE

## 2016-01-23 LAB — LACTIC ACID, PLASMA
LACTIC ACID, VENOUS: 2.2 mmol/L — AB (ref 0.5–2.0)
LACTIC ACID, VENOUS: 3.3 mmol/L — AB (ref 0.5–2.0)

## 2016-01-23 LAB — COMPREHENSIVE METABOLIC PANEL
ALBUMIN: 4 g/dL (ref 3.5–5.0)
ALT: 12 U/L — AB (ref 17–63)
AST: 31 U/L (ref 15–41)
Alkaline Phosphatase: 72 U/L (ref 38–126)
Anion gap: 11 (ref 5–15)
BUN: 28 mg/dL — AB (ref 6–20)
CHLORIDE: 111 mmol/L (ref 101–111)
CO2: 20 mmol/L — AB (ref 22–32)
CREATININE: 1.26 mg/dL — AB (ref 0.61–1.24)
Calcium: 9.1 mg/dL (ref 8.9–10.3)
GFR calc Af Amer: >60 mL/min (ref >60)
GFR calc non Af Amer: 55 mL/min — ABNORMAL LOW (ref >60)
Glucose, Bld: 132 mg/dL — ABNORMAL HIGH (ref 65–99)
Potassium: 4.8 mmol/L (ref 3.5–5.1)
SODIUM: 142 mmol/L (ref 135–145)
Total Bilirubin: 2.4 mg/dL — ABNORMAL HIGH (ref 0.3–1.2)
Total Protein: 7.7 g/dL (ref 6.5–8.1)

## 2016-01-23 LAB — CBC
HCT: 51.1 % (ref 40.0–52.0)
Hemoglobin: 16.5 g/dL (ref 13.0–18.0)
MCH: 27.2 pg (ref 26.0–34.0)
MCHC: 32.3 g/dL (ref 32.0–36.0)
MCV: 84.3 fL (ref 80.0–100.0)
PLATELETS: 139 10*3/uL — AB (ref 150–440)
RBC: 6.06 MIL/uL — AB (ref 4.40–5.90)
RDW: 20.8 % — ABNORMAL HIGH (ref 11.5–14.5)
WBC: 13.2 10*3/uL — AB (ref 3.8–10.6)

## 2016-01-23 LAB — TROPONIN I: Troponin I: 0.16 ng/mL — ABNORMAL HIGH (ref <0.031)

## 2016-01-23 LAB — PHOSPHORUS: Phosphorus: 3.2 mg/dL (ref 2.5–4.6)

## 2016-01-23 LAB — MAGNESIUM: MAGNESIUM: 2.2 mg/dL (ref 1.7–2.4)

## 2016-01-23 MED ORDER — DEXTROSE 5 % IV SOLN
2.0000 g | Freq: Once | INTRAVENOUS | Status: AC
  Administered 2016-01-23: 2 g via INTRAVENOUS
  Filled 2016-01-23: qty 2

## 2016-01-23 MED ORDER — MORPHINE SULFATE (PF) 2 MG/ML IV SOLN
2.0000 mg | Freq: Once | INTRAVENOUS | Status: AC
  Administered 2016-01-23: 2 mg via INTRAVENOUS
  Filled 2016-01-23: qty 1

## 2016-01-23 MED ORDER — IPRATROPIUM-ALBUTEROL 0.5-2.5 (3) MG/3ML IN SOLN
3.0000 mL | Freq: Once | RESPIRATORY_TRACT | Status: AC
  Administered 2016-01-23: 3 mL via RESPIRATORY_TRACT
  Filled 2016-01-23: qty 3

## 2016-01-23 MED ORDER — METHYLPREDNISOLONE SODIUM SUCC 125 MG IJ SOLR
125.0000 mg | Freq: Once | INTRAMUSCULAR | Status: AC
  Administered 2016-01-23: 125 mg via INTRAVENOUS
  Filled 2016-01-23: qty 2

## 2016-01-23 MED ORDER — ACETAMINOPHEN 325 MG PO TABS: 650.0000 mg | ORAL_TABLET | ORAL | Status: DC | PRN

## 2016-01-23 MED ORDER — SODIUM CHLORIDE 0.9 % IV SOLN: 250.0000 mL | INTRAVENOUS | Status: DC | PRN

## 2016-01-23 MED ORDER — SODIUM CHLORIDE 0.9 % IV BOLUS (SEPSIS)
1000.0000 mL | INTRAVENOUS | Status: AC
  Administered 2016-01-23 (×2): 1000 mL via INTRAVENOUS

## 2016-01-23 MED ORDER — IPRATROPIUM-ALBUTEROL 0.5-2.5 (3) MG/3ML IN SOLN
3.0000 mL | Freq: Once | RESPIRATORY_TRACT | Status: AC
  Administered 2016-01-23: 3 mL via RESPIRATORY_TRACT

## 2016-01-23 MED ORDER — ONDANSETRON HCL 4 MG/2ML IJ SOLN: 4.0000 mg | Freq: Four times a day (QID) | INTRAMUSCULAR | Status: DC | PRN

## 2016-01-23 MED ORDER — SODIUM CHLORIDE 0.9% FLUSH
3.0000 mL | Freq: Two times a day (BID) | INTRAVENOUS | Status: DC
  Administered 2016-01-23 – 2016-01-25 (×4): 3 mL via INTRAVENOUS

## 2016-01-23 MED ORDER — METHYLPREDNISOLONE SODIUM SUCC 40 MG IJ SOLR
40.0000 mg | Freq: Two times a day (BID) | INTRAMUSCULAR | Status: DC
  Administered 2016-01-24 – 2016-02-01 (×17): 40 mg via INTRAVENOUS
  Filled 2016-01-23 (×17): qty 1

## 2016-01-23 MED ORDER — VANCOMYCIN HCL IN DEXTROSE 1-5 GM/200ML-% IV SOLN
1000.0000 mg | INTRAVENOUS | Status: DC
  Administered 2016-01-24: 1000 mg via INTRAVENOUS
  Filled 2016-01-23 (×2): qty 200

## 2016-01-23 MED ORDER — ENSURE ENLIVE PO LIQD
237.0000 mL | Freq: Four times a day (QID) | ORAL | Status: DC
  Administered 2016-01-24 (×3): 237 mL via ORAL

## 2016-01-23 MED ORDER — CETYLPYRIDINIUM CHLORIDE 0.05 % MT LIQD
7.0000 mL | Freq: Two times a day (BID) | OROMUCOSAL | Status: DC
  Administered 2016-01-23 – 2016-01-26 (×4): 7 mL via OROMUCOSAL

## 2016-01-23 MED ORDER — SODIUM CHLORIDE 0.9 % IV BOLUS (SEPSIS): 500.0000 mL | INTRAVENOUS | Status: AC

## 2016-01-23 MED ORDER — SODIUM CHLORIDE 0.9% FLUSH: 3.0000 mL | INTRAVENOUS | Status: DC | PRN

## 2016-01-23 MED ORDER — DEXTROSE 5 % IV SOLN
250.0000 mg | INTRAVENOUS | Status: AC
  Administered 2016-01-23 – 2016-01-27 (×5): 250 mg via INTRAVENOUS
  Filled 2016-01-23 (×5): qty 250

## 2016-01-23 MED ORDER — BUDESONIDE 0.5 MG/2ML IN SUSP
0.5000 mg | Freq: Two times a day (BID) | RESPIRATORY_TRACT | Status: DC
  Administered 2016-01-23 – 2016-01-27 (×8): 0.5 mg via RESPIRATORY_TRACT
  Filled 2016-01-23 (×8): qty 2

## 2016-01-23 MED ORDER — IPRATROPIUM-ALBUTEROL 0.5-2.5 (3) MG/3ML IN SOLN
RESPIRATORY_TRACT | Status: AC
  Administered 2016-01-23: 3 mL
  Filled 2016-01-23: qty 6

## 2016-01-23 MED ORDER — FAMOTIDINE IN NACL 20-0.9 MG/50ML-% IV SOLN
20.0000 mg | Freq: Two times a day (BID) | INTRAVENOUS | Status: DC
  Administered 2016-01-23 – 2016-01-26 (×6): 20 mg via INTRAVENOUS
  Filled 2016-01-23 (×8): qty 50

## 2016-01-23 MED ORDER — MOMETASONE FURO-FORMOTEROL FUM 100-5 MCG/ACT IN AERO
2.0000 | INHALATION_SPRAY | Freq: Two times a day (BID) | RESPIRATORY_TRACT | Status: DC
  Administered 2016-01-23 – 2016-01-31 (×16): 2 via RESPIRATORY_TRACT
  Filled 2016-01-23: qty 8.8

## 2016-01-23 MED ORDER — VANCOMYCIN HCL IN DEXTROSE 1-5 GM/200ML-% IV SOLN
1000.0000 mg | Freq: Once | INTRAVENOUS | Status: AC
  Administered 2016-01-23: 1000 mg via INTRAVENOUS
  Filled 2016-01-23: qty 200

## 2016-01-23 MED ORDER — TIOTROPIUM BROMIDE MONOHYDRATE 18 MCG IN CAPS
18.0000 ug | ORAL_CAPSULE | Freq: Every day | RESPIRATORY_TRACT | Status: DC
  Administered 2016-01-24 – 2016-01-31 (×8): 18 ug via RESPIRATORY_TRACT
  Filled 2016-01-23 (×2): qty 5

## 2016-01-23 MED ORDER — ALBUTEROL SULFATE (2.5 MG/3ML) 0.083% IN NEBU
2.5000 mg | INHALATION_SOLUTION | RESPIRATORY_TRACT | Status: DC
  Administered 2016-01-23 – 2016-01-31 (×47): 2.5 mg via RESPIRATORY_TRACT
  Filled 2016-01-23 (×47): qty 3

## 2016-01-23 MED ORDER — ENOXAPARIN SODIUM 40 MG/0.4ML ~~LOC~~ SOLN: 30.0000 mg | SUBCUTANEOUS | Status: DC

## 2016-01-23 NOTE — ED Notes (Addendum)
Pt rushed back from triage due to visibly cyanotic lips and fingertips and tachycardia.  Upon arrival to ED room pt had O2 stat of 50%. Pt place on non re-breather and stat at 77%. Pt states that his normal O2 stats are in the 70's.

## 2016-01-23 NOTE — ED Provider Notes (Signed)
[facility] Emergency Department Provider Note  ____________________________________________  Time seen:  Seen upon arrival to the treatment area  I have reviewed the triage vital signs and the nursing notes.   HISTORY  Chief Complaint Shortness of Breath and Chest Pain   HPI Charles Barton is a 75 y.o. male with a history of COPD on 4 L of oxygen who is presenting to the emergency department left-sided chest pain and shortness of breath since this past Thursday. He says the pain is cramping and worsened with deep breathing. He says that he has not had pain like this before with his COPD. He denies any cough or fever. Says that he is on 4 L of nasal cannula oxygen at home and usually has an oxygen saturation of about 88%.  Found to have an oxygen saturation of 49% in triage and brought immediately back to the treatment room.   Past Medical History  Diagnosis Date  . Abdominal aneurysm ([place])   . 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 Cancer [facility])     prostate  . Hypertension   . Hyperlipidemia   . CAD (coronary artery disease)   . COPD (chronic obstructive pulmonary disease) ([place])   . AAA (abdominal aortic aneurysm) ([place])   . 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 Aortic atherosclerosis [facility])     Patient Active Problem List   Diagnosis Date Noted  . 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 Hypertension 03/25/2015  . COPD (chronic obstructive pulmonary disease) ([place]) 03/25/2015  . AAA (abdominal aortic aneurysm) ([place]) 03/25/2015  . 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 Polycythemia, secondary 02/11/2015    Past Surgical History  Procedure Laterality Date  . Abdominal aortic aneurysm repair  2014  . Femoral artery aneurysm repair  2014  . Femoral artery aneurysm repair    . Abdominal aortic aneurysm repair      Current Outpatient Rx  Name  Route  Sig  Dispense  Refill  .  albuterol (ACCUNEB) 0.63 MG/3ML nebulizer solution   Nebulization   Take 1 ampule by nebulization every 6 (six) hours as needed for wheezing.         Marland Kitchen albuterol (PROVENTIL HFA;VENTOLIN HFA) 108 (90 BASE) MCG/ACT inhaler   Inhalation   Inhale 2 puffs into the lungs every 6 (six) hours as needed for wheezing or shortness of breath.         . budesonide-formoterol (SYMBICORT) 80-4.5 MCG/ACT inhaler   Inhalation   Inhale 2 puffs into the lungs 2 (two) times daily.         . fluticasone (FLONASE) 50 MCG/ACT nasal spray   Each Nare   Place 1 spray into both nostrils daily.   3 g   4   . OPSUMIT 10 MG TABS      10 mg daily.           Dispense as written.   . tiotropium (SPIRIVA) 18 MCG inhalation capsule   Inhalation   Place 18 mcg into inhaler and inhale daily.           Allergies No known allergies  Family History  Problem Relation Age of Onset  . Heart disease Mother   . Emphysema Father   . Diabetes Paternal Grandmother     Social History Social History  Substance Use Topics  . Smoking status: Former Smoker    Quit date: 01/01/2007  . Smokeless tobacco: Never Used  .  Alcohol Use: No    Review of Systems Constitutional: No fever/chills Eyes: No visual changes. ENT: No sore throat. Cardiovascular: As above Respiratory: As above Gastrointestinal: No abdominal pain.  No nausea, no vomiting.  No diarrhea.  No constipation. Genitourinary: Negative for dysuria. Musculoskeletal: Negative for back pain. Skin: Negative for rash. Neurological: Negative for headaches, focal weakness or numbness.  10-point ROS otherwise negative.  ____________________________________________   PHYSICAL EXAM:  VITAL SIGNS: ED Triage Vitals  Enc Vitals Group     BP --      Pulse --      Resp --      Temp --      Temp src --      SpO2 --      Weight --      Height --      Head Cir --      Peak Flow --      Pain Score 01/25/2016 1442 6     Pain Loc --      Pain  Edu? --      Excl. in [place]? --     Constitutional: Alert and oriented. Well appearing and in no acute distress. Eyes: Conjunctivae are normal. PERRL. EOMI. Head: Atraumatic. Nose: No congestion/rhinnorhea. Mouth/Throat: Mucous membranes are moist.  Oropharynx non-erythematous. Neck: No stridor.   Cardiovascular: Tachycardic, regular rhythm. Grossly normal heart sounds.   Respiratory: He can take with respiratory distress. Speaking in full sentences. Use of accessory muscles. Severely decreased air movement throughout with minimal air movement throughout. Gastrointestinal: Soft and nontender. No distention.  Musculoskeletal: No lower extremity tenderness nor edema.  No joint effusions. Neurologic:  Normal speech and language. No gross focal neurologic deficits are appreciated.  Skin:  Skin is warm, dry and intact. No rash noted. Psychiatric: Mood and affect are normal. Speech and behavior are normal.  ____________________________________________   LABS (all labs ordered are listed, but only abnormal results are displayed)  Labs Reviewed  CBC  COMPREHENSIVE METABOLIC PANEL  TROPONIN I   ____________________________________________  EKG  ED ECG REPORT I, Grant Henkes,  Youlanda Roys, the attending physician, personally viewed and interpreted this ECG.   Date: 01/26/2016  EKG Time: 1444  Rate: 1:15  Rhythm: sinus tachycardia  Axis: Normal axis  Intervals:right bundle branch block  ST&T Change: No ST segment elevation or depression. T-wave inversions in 3, aVF, V2 through V5.  ____________________________________________  RADIOLOGY   DG Chest Port 1 View (Final result) Result time: 01/09/2016 15:17:16   Procedure changed from [facility] Chest 2 View      Final result by Rad Results In Interface (01/28/2016 15:17:16)   Narrative:   CLINICAL DATA: Chest pain and shortness of breath  EXAM: PORTABLE CHEST 1 VIEW  COMPARISON: 09/15/2014 chest radiograph.  FINDINGS: Stable  cardiomediastinal silhouette with normal heart size. No pneumothorax. Possible small left pleural effusion. No appreciable right pleural effusion. Advanced emphysema. Mild patchy left upper parahilar lung opacity. Patchy opacity at the left greater than right lung bases.  IMPRESSION: 1. Mild patchy left upper parahilar and bibasilar lung opacities, worrisome for multifocal pneumonia . 2. Advanced emphysema. 3. Possible small left pleural effusion.   Electronically Signed By: Ilona Sorrel M.D. On: 01/11/2016 15:17       ____________________________________________   PROCEDURES  CRITICAL CARE Performed by: Doran Stabler   Total critical care time: 35 minutes  Critical care time was exclusive of separately billable procedures and treating other patients.  Critical care was necessary to  treat or prevent imminent or life-threatening deterioration.  Critical care was time spent personally by me on the following activities: development of treatment plan with patient and/or surrogate as well as nursing, discussions with consultants, evaluation of patient's response to treatment, examination of patient, obtaining history from patient or surrogate, ordering and performing treatments and interventions, ordering and review of laboratory studies, ordering and review of radiographic studies, pulse oximetry and re-evaluation of patient's condition.  ____________________________________________   INITIAL IMPRESSION / ASSESSMENT AND PLAN / ED COURSE  Pertinent labs & imaging results that were available during my care of the patient were reviewed by me and considered in my medical decision making (see chart for details).  ----------------------------------------- 3:41 PM on 01/03/2016 -----------------------------------------  Patient placed on BiPAP with duo nebs and is now satting in the 90s. He says that he is feeling much improved. Found to have left upper lobe  pneumonia. We'll admit to the ICU. Discussed with the patient as well as the family are understanding willing to comply. Patient given broad-spectrum antibiotics secondary to critical nature of presentation.  ----------------------------------------- 4:00 PM on 01/19/2016 -----------------------------------------  Signed out to the ICU attending, Dr. Oletta Darter. ____________________________________________   FINAL CLINICAL IMPRESSION(S) / ED DIAGNOSES  Hypoxia. COPD exacerbation. Multifocal pneumonia    Orbie Pyo, MD 01/05/2016 [phone]

## 2016-01-23 NOTE — Consult Note (Addendum)
Pharmacy Antibiotic Note  Charles Barton is a 75 y.o. male admitted on 01/19/2016 with pneumonia.  Pharmacy has been consulted for vancomycin and meropenem dosing.  Plan: Patient received 1g Iv vancomycin once in ED. Will start 1g dose 6 hours after initial dose for stacked dosing. Vancomycin 1000 mg IV every 18 hours.  Goal trough 15-20 mcg/mL. meropenem 1g q 12 hours   Will draw vancomycin trough prior to 5th dose 4/26 @ 0330  Height: _0  (177.8 cm) Weight: 150 lb (68.04 kg) IBW/kg (Calculated) : 73  No data recorded.   Recent Labs Lab 01/14/2016 1453 01/13/2016 1611  WBC 13.2*  --   CREATININE 1.26*  --   LATICACIDVEN  --  2.2*    Estimated Creatinine Clearance: 50.2 mL/min (by C-G formula based on Cr of 1.26).    Allergies  Allergen Reactions  . No Known Allergies     Antimicrobials this admission: cefepime 4/23 >> one dose meropenem 4/23 >>  Vancomycin 4/23>>  Dose adjustments this admission:   Microbiology results: 4/23 BCx: pending   Thank you for allowing pharmacy to be a part of this patient's care.  Ramond Dial, Pharm.D Clinical Pharmacist  01/11/2016 4:56 PM

## 2016-01-23 NOTE — ED Notes (Signed)
Patient arrived to [facility] ed via [facility] with c/o shortness of breath and left sided chest pain since thursday

## 2016-01-23 NOTE — H&P (Signed)
[facility] Consultation      Name: Charles Barton MRN: [id number] DOB: 07-30-1941    ADMISSION DATE:  01/20/2016    CHIEF COMPLAINT:  Acute resp failure   HISTORY OF PRESENT ILLNESS  75 yo white male with acute and severe resp failure, patient on biPAP fio2 100% Increased WOB, increased SOB, using accessory muscles to breathe  Patient is alert/awake for now and I have confirmed DNR/DNI status with patient and family Patient is otherwise unable to provide ROS CXR and CT chest reviewed with family  CXR and CT chest reveio    PAST MEDICAL HISTORY    :  Past Medical History  Diagnosis Date  . Abdominal aneurysm ([place])   . 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 Cancer [facility])     prostate  . Hypertension   . Hyperlipidemia   . CAD (coronary artery disease)   . COPD (chronic obstructive pulmonary disease) ([place])   . AAA (abdominal aortic aneurysm) ([place])   . 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 Aortic atherosclerosis [facility])    Past Surgical History  Procedure Laterality Date  . Abdominal aortic aneurysm repair  2014  . Femoral artery aneurysm repair  2014  . Femoral artery aneurysm repair    . Abdominal aortic aneurysm repair     Prior to Admission medications   Medication Sig Start Date End Date Taking? Authorizing Provider  albuterol (ACCUNEB) 0.63 MG/3ML nebulizer solution Take 1 ampule by nebulization every 6 (six) hours as needed for wheezing.   Yes Historical Provider, MD  albuterol (PROVENTIL HFA;VENTOLIN HFA) 108 (90 BASE) MCG/ACT inhaler Inhale 2 puffs into the lungs every 6 (six) hours as needed for wheezing or shortness of breath.   Yes Historical Provider, MD  budesonide-formoterol (SYMBICORT) 80-4.5 MCG/ACT inhaler Inhale 2 puffs into the lungs 2 (two) times daily.   Yes Historical Provider, MD  OPSUMIT 10 MG TABS Take 10 mg by mouth daily.  10/17/15  Yes Historical Provider, MD  OXYGEN Inhale 3-3.5 L/min into the lungs  continuous.   Yes Historical Provider, MD  tiotropium (SPIRIVA) 18 MCG inhalation capsule Place 18 mcg into inhaler and inhale daily.   Yes Historical Provider, MD  fluticasone (FLONASE) 50 MCG/ACT nasal spray Place 1 spray into both nostrils daily. 03/25/15   Guadalupe Maple, MD   Allergies  Allergen Reactions  . No Known Allergies      FAMILY HISTORY   Family History  Problem Relation Age of Onset  . Heart disease Mother   . Emphysema Father   . Diabetes Paternal Grandmother       SOCIAL HISTORY    reports that he quit smoking about 9 years ago. He has never used smokeless tobacco. He reports that he does not drink alcohol or use illicit drugs.  Review of Systems  Unable to perform ROS: critical illness      VITAL SIGNS    Pulse Rate:  [109-116] 109 (04/23 1530) Resp:  [22-24] 24 (04/23 1530) BP: (102-131)/(79-95) 123/92 mmHg (04/23 1530) SpO2:  [72 %-99 %] 99 % (04/23 1530) Weight:  [150 lb (68.04 kg)] 150 lb (68.04 kg) (04/23 1544) HEMODYNAMICS:   VENTILATOR SETTINGS:   INTAKE / OUTPUT: No intake or output data in the 24 hours ending 01/30/2016 1637     PHYSICAL EXAM   Physical Exam  Constitutional: He appears distressed.  Thin and cachectic  HENT:  Head: Normocephalic and atraumatic.  Eyes: Pupils are equal,  round, and reactive to light. No scleral icterus.  Neck: Normal range of motion. Neck supple.  Cardiovascular: Normal rate and regular rhythm.   No murmur heard. Pulmonary/Chest: He is in respiratory distress. He has wheezes. He has rales.  resp distress  Abdominal: Soft. He exhibits no distension. There is no tenderness.  Musculoskeletal: He exhibits no edema.  Neurological: He is alert. He displays normal reflexes. Coordination normal.  Skin: Skin is warm. No rash noted. He is diaphoretic.       LABS   LABS:  CBC  Recent Labs Lab 01/29/2016 1453  WBC 13.2*  HGB 16.5  HCT 51.1  PLT 139*   Coag's No results for input(s): APTT,  INR in the last 168 hours. BMET  Recent Labs Lab 01/03/2016 1453  NA 142  K 4.8  CL 111  CO2 20*  BUN 28*  CREATININE 1.26*  GLUCOSE 132*   Electrolytes  Recent Labs Lab 01/22/2016 1453  CALCIUM 9.1   Sepsis Markers No results for input(s): LATICACIDVEN, PROCALCITON, O2SATVEN in the last 168 hours. ABG  Recent Labs Lab 01/17/2016 1620  PHART 7.39  PCO2ART 29*  PO2ART 68*   Liver Enzymes  Recent Labs Lab 01/06/2016 1453  AST 31  ALT 12*  ALKPHOS 72  BILITOT 2.4*  ALBUMIN 4.0   Cardiac Enzymes  Recent Labs Lab 01/16/2016 1453  TROPONINI 0.16*   Glucose No results for input(s): GLUCAP in the last 168 hours.   No results found for this or any previous visit (from the past 240 hour(s)).   Current facility-administered medications:  .  0.9 %  sodium chloride infusion, 250 mL, Intravenous, PRN, Flora Lipps, MD .  0.9 %  sodium chloride infusion, 250 mL, Intravenous, PRN, Flora Lipps, MD .  acetaminophen (TYLENOL) tablet 650 mg, 650 mg, Oral, Q4H PRN, Flora Lipps, MD .  albuterol (PROVENTIL) (2.5 MG/3ML) 0.083% nebulizer solution 2.5 mg, 2.5 mg, Nebulization, Q4H, Flora Lipps, MD .  budesonide (PULMICORT) nebulizer solution 0.5 mg, 0.5 mg, Nebulization, BID, Flora Lipps, MD .  ceFEPIme (MAXIPIME) 2 g in dextrose 5 % 50 mL IVPB, 2 g, Intravenous, Once, Orbie Pyo, MD, Last Rate: 100 mL/hr at 01/08/2016 1615, 2 g at 01/24/2016 1615 .  enoxaparin (LOVENOX) injection 30 mg, 30 mg, Subcutaneous, Q24H, Flora Lipps, MD .  famotidine (PEPCID) IVPB 20 mg premix, 20 mg, Intravenous, Q12H, Flora Lipps, MD .  methylPREDNISolone sodium succinate (SOLU-MEDROL) 40 mg/mL injection 40 mg, 40 mg, Intravenous, Q12H, Flora Lipps, MD .  mometasone-formoterol (DULERA) 100-5 MCG/ACT inhaler 2 puff, 2 puff, Inhalation, BID, Flora Lipps, MD .  ondansetron (ZOFRAN) injection 4 mg, 4 mg, Intravenous, Q6H PRN, Flora Lipps, MD .  sodium chloride 0.9 % bolus 1,000 mL, 1,000 mL,  Intravenous, Q1H, Last Rate: 1,000 mL/hr at 01/17/2016 1541, 1,000 mL at 01/30/2016 1541 **FOLLOWED BY** sodium chloride 0.9 % bolus 500 mL, 500 mL, Intravenous, Q1H, Orbie Pyo, MD .  sodium chloride flush (NS) 0.9 % injection 3 mL, 3 mL, Intravenous, Q12H, Flora Lipps, MD .  sodium chloride flush (NS) 0.9 % injection 3 mL, 3 mL, Intravenous, PRN, Flora Lipps, MD .  tiotropium (SPIRIVA) inhalation capsule 18 mcg, 18 mcg, Inhalation, Daily, Flora Lipps, MD .  vancomycin (VANCOCIN) IVPB 1000 mg/200 mL premix, 1,000 mg, Intravenous, Once, Orbie Pyo, MD, Last Rate: 200 mL/hr at 01/04/2016 1614, 1,000 mg at 01/18/2016 1614  Current outpatient prescriptions:  .  albuterol (ACCUNEB) 0.63 MG/3ML nebulizer solution, Take 1 ampule  by nebulization every 6 (six) hours as needed for wheezing., Disp: , Rfl:  .  albuterol (PROVENTIL HFA;VENTOLIN HFA) 108 (90 BASE) MCG/ACT inhaler, Inhale 2 puffs into the lungs every 6 (six) hours as needed for wheezing or shortness of breath., Disp: , Rfl:  .  budesonide-formoterol (SYMBICORT) 80-4.5 MCG/ACT inhaler, Inhale 2 puffs into the lungs 2 (two) times daily., Disp: , Rfl:  .  OPSUMIT 10 MG TABS, Take 10 mg by mouth daily. , Disp: , Rfl:  .  OXYGEN, Inhale 3-3.5 L/min into the lungs continuous., Disp: , Rfl:  .  tiotropium (SPIRIVA) 18 MCG inhalation capsule, Place 18 mcg into inhaler and inhale daily., Disp: , Rfl:  .  fluticasone (FLONASE) 50 MCG/ACT nasal spray, Place 1 spray into both nostrils daily., Disp: 3 g, Rfl: 4  IMAGING    Dg Chest Port 1 View  01/05/2016  CLINICAL DATA:  Chest pain and shortness of breath EXAM: PORTABLE CHEST 1 VIEW COMPARISON:  09/15/2014 chest radiograph. FINDINGS: Stable cardiomediastinal silhouette with normal heart size. No pneumothorax. Possible small left pleural effusion. No appreciable right pleural effusion. Advanced emphysema. Mild patchy left upper parahilar lung opacity. Patchy opacity at the left greater  than right lung bases. IMPRESSION: 1. Mild patchy left upper parahilar and bibasilar lung opacities, worrisome for multifocal pneumonia . 2. Advanced emphysema. 3. Possible small left pleural effusion. Electronically Signed   By: Ilona Sorrel M.D.   On: 01/22/2016 15:17          MICRO DATA: MRSA PCR  pending Urine  Blood Resp   ANTIMICROBIALS: Vancomycin/merepenem 4/23 >>>    ASSESSMENT/PLAN  75 yo white male with severe end stage bullous emphysema with acute resp failure from acute left lung pneumonia with severe COPD exacerbation, patient on biPAP and tolerating OK for now.  Patient is DNR/DNI  PULMONARY -Respiratory Failure -biPAP/high flow [place] as tolerated -continue aggressive Bronchodilator Therapy -IV steroids/IV abx -may need morphine as needed   CARDIOVASCULAR Needs ICU monitoting  RENAL Follow UO  GASTROINTESTINAL Keep NPO   INFECTIOUS Pneumonia-vanc/merepenem     I have personally obtained a history, examined the patient, evaluated laboratory and independently reviewed  imaging results, formulated the assessment and plan and placed orders.  The Patient requires high complexity decision making for assessment and support, frequent evaluation and titration of therapies, application of advanced monitoring technologies and extensive interpretation of multiple databases. Critical Care Time devoted to patient care services described in this note is 45 minutes.   Overall, patient is critically ill, prognosis is guarded. Patient at high risk for cardiac arrest and death. Family updated and notified   Therma Lasure Patricia Pesa, M.D.  [facility]  Medical Director [place] Director [facility] Cardio-Pulmonary Department

## 2016-01-24 ENCOUNTER — Inpatient Hospital Stay: Payer: Commercial Managed Care - HMO

## 2016-01-24 ENCOUNTER — Encounter: Payer: Self-pay | Admitting: Pulmonary Disease

## 2016-01-24 DIAGNOSIS — J189 Pneumonia, unspecified organism: Secondary | ICD-10-CM

## 2016-01-24 LAB — BASIC METABOLIC PANEL
ANION GAP: 10 (ref 5–15)
BUN: 28 mg/dL — ABNORMAL HIGH (ref 6–20)
CALCIUM: 8.7 mg/dL — AB (ref 8.9–10.3)
CO2: 20 mmol/L — AB (ref 22–32)
Chloride: 113 mmol/L — ABNORMAL HIGH (ref 101–111)
Creatinine, Ser: 1.05 mg/dL (ref 0.61–1.24)
GFR calc non Af Amer: >60 mL/min (ref >60)
GLUCOSE: 182 mg/dL — AB (ref 65–99)
POTASSIUM: 4.2 mmol/L (ref 3.5–5.1)
Sodium: 143 mmol/L (ref 135–145)

## 2016-01-24 LAB — URINALYSIS COMPLETE WITH MICROSCOPIC (ARMC ONLY)
Bilirubin Urine: NEGATIVE
GLUCOSE, UA: 50 mg/dL — AB
Hgb urine dipstick: NEGATIVE
LEUKOCYTES UA: NEGATIVE
Nitrite: NEGATIVE
Protein, ur: 100 mg/dL — AB
Specific Gravity, Urine: 1.034 — ABNORMAL HIGH (ref 1.005–1.030)
pH: 5 (ref 5.0–8.0)

## 2016-01-24 LAB — BLOOD GAS, ARTERIAL
ACID-BASE DEFICIT: 4.4 mmol/L — AB (ref 0.0–2.0)
Bicarbonate: 19 mEq/L — ABNORMAL LOW (ref 21.0–28.0)
FIO2: 88
O2 SAT: 86.1 %
PH ART: 7.41 (ref 7.350–7.450)
Patient temperature: 37
pCO2 arterial: 30 mmHg — ABNORMAL LOW (ref 32.0–48.0)
pO2, Arterial: 51 mmHg — ABNORMAL LOW (ref 83.0–108.0)

## 2016-01-24 LAB — CBC
HEMATOCRIT: 43 % (ref 40.0–52.0)
HEMOGLOBIN: 13.9 g/dL (ref 13.0–18.0)
MCH: 26.9 pg (ref 26.0–34.0)
MCHC: 32.4 g/dL (ref 32.0–36.0)
MCV: 83.1 fL (ref 80.0–100.0)
Platelets: 108 10*3/uL — ABNORMAL LOW (ref 150–440)
RBC: 5.17 MIL/uL (ref 4.40–5.90)
RDW: 20.5 % — AB (ref 11.5–14.5)
WBC: 8.4 10*3/uL (ref 3.8–10.6)

## 2016-01-24 MED ORDER — ENOXAPARIN SODIUM 40 MG/0.4ML ~~LOC~~ SOLN
40.0000 mg | SUBCUTANEOUS | Status: DC
  Administered 2016-01-24 – 2016-01-30 (×7): 40 mg via SUBCUTANEOUS
  Filled 2016-01-24 (×7): qty 0.4

## 2016-01-24 MED ORDER — DEXTROSE 5 % IV SOLN
1.0000 g | INTRAVENOUS | Status: AC
  Administered 2016-01-24 – 2016-01-30 (×7): 1 g via INTRAVENOUS
  Filled 2016-01-24 (×8): qty 10

## 2016-01-24 MED ORDER — ENSURE ENLIVE PO LIQD
237.0000 mL | Freq: Three times a day (TID) | ORAL | Status: DC
  Administered 2016-01-24 – 2016-02-02 (×20): 237 mL via ORAL

## 2016-01-24 MED ORDER — SODIUM CHLORIDE 0.9 % IV SOLN
1.0000 g | Freq: Two times a day (BID) | INTRAVENOUS | Status: DC
  Administered 2016-01-24: 1 g via INTRAVENOUS
  Filled 2016-01-24 (×2): qty 1

## 2016-01-24 NOTE — Progress Notes (Signed)
Chaplain rounded the unit and provided a compassionate presence and spiritual support to the patient and family.  Minerva Fester [phone]

## 2016-01-24 NOTE — Progress Notes (Signed)
Initial Nutrition Assessment  DOCUMENTATION CODES:   Severe malnutrition in context of chronic illness  INTERVENTION:  -Recommend liberalizing diet to Regular -Recommend continuing Ensure Enlive, each supplement provides 350 kcal and 20 grams of protein; recommend changing frequency to TID between meals -Pt would like benefit from smaller, more frequent meals; pt currently receiving Ensure as snack between meals  NUTRITION DIAGNOSIS:   Malnutrition related to chronic illness as evidenced by severe depletion of body fat, severe depletion of muscle mass.  GOAL:   Patient will meet greater than or equal to 90% of their needs  MONITOR:   PO intake, Supplement acceptance, Weight trends  REASON FOR ASSESSMENT:   Consult Assessment of nutrition requirement/status  ASSESSMENT:    75 yo male admitted with severe acute respiratory failure with severe COPD exacerbation with end stage bullous emphysema and acute left lung pneumonia . Pt initially on BiPap, currently on HFNC. Pt reports he is feeling much better, although pt desats during conversation on visit today.  Past Medical History  Diagnosis Date  . Abdominal aneurysm ([place])   . 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 Cancer [facility])     prostate  . Hypertension   . Hyperlipidemia   . CAD (coronary artery disease)   . COPD (chronic obstructive pulmonary disease) ([place])   . AAA (abdominal aortic aneurysm) ([place])   . 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 Aortic atherosclerosis ([place])    Diet Order:  Heart Healthy  Energy Intake: pt ate 100% at breakfast today, 90% at lunch. Pt reports good appetite at present. Pt also reports he drank 1 Ensure last night, reports he had never tried before  Food and Nutrition Related history: pt reports poor appetite for a while, pt has not been using nutritional supplements at home. Pt reports he normally eats half the amount of food that he has eaten today (ate 90-100% of breakfast and  lunch).   Skin:  Reviewed, no issues  Last BM:  01/22/16   Labs: reviewed  Meds: solumedrol   Nutrition Focused Physical Exam: Nutrition-Focused physical exam completed. Findings are mild to severe fat depletion,  moderate to severe muscle depletion, and moderate edema. Pt reports chronic LE edema, reports his legs swell during the day and then go down at night  Height:   Ht Readings from Last 1 Encounters:  01/25/2016 5' 10" (1.778 m)    Weight: pt reports UBW of 254 pounds (but reports he weighed this a while ago); pt reports wt has been stable recently. Pt reports he is happy he hast lost the weight but he knows that some of this has been muscle loss.   Wt Readings from Last 1 Encounters:  01/24/16 165 lb 2 oz (74.9 kg)    Filed Weights   01/13/2016 1544 01/02/2016 1847 01/24/16 0450  Weight: 150 lb (68.04 kg) 157 lb 6.5 oz (71.4 kg) 165 lb 2 oz (74.9 kg)    BMI:  Body mass index is 23.69 kg/(m^2).  Estimated Nutritional Needs:   Kcal:  0230-1720 kcals (30-35 kcals/kg)   Protein:  90-113 g (1.2-1.5 g/kg)   Fluid:  >2 L per day  EDUCATION NEEDS:   No education needs identified at this time  [place], [place], [place] [phone] Pager  [phone] Weekend/On-Call Pager

## 2016-01-24 NOTE — Care Management Note (Signed)
Case Management Note  Patient Details  Name: Charles Barton MRN: [id number] Date of Birth: 04-Oct-1940  Subjective/Objective:   CM assessment for discharge planning.  Severe Emphysema and COPD exacerbation. Attempted to see patient but staff was working with patient at multiple visits. TC to spouse, Charles Barton. Patient lives at home with his wife. He is independent but has limited mobility due to "end stage disease process" per wife. No DME. Chronic O2 @ 3-4  L through [employer]. No home health. Discussed possible  home health nursing. Wife states patient is a very private person. He could not tolerate any home PT due to severity of disease. He also would not like anyone coming into the home unless absolutely necessary. Briefly passed the idea to wife asking if she or patient had ever been spoken to about hospice services. She was kind but stated they absolutely did not want that and it has never been mentioned to them. They did not want to use that service until the "end."  Pt current with PCP. Wife denies issues accessing medical care, copays or transportation.  Refused any home health at discharge.             Action/Plan: Refused home health services. Will continue to monitor progression and assist as needed with discharge plan.   Expected Discharge Date:                  Expected Discharge Plan:  Home/Self Care  In-House Referral:     Discharge planning Services  CM Consult  Post Acute Care Choice:    Choice offered to:     DME Arranged:    DME Agency:     HH Arranged:  Patient Refused HH Agency:     Status of Service:  In process, will continue to follow  Medicare Important Message Given:    Date Medicare IM Given:    Medicare IM give by:    Date Additional Medicare IM Given:    Additional Medicare Important Message give by:     If discussed at [place] of Stay Meetings, dates discussed:    Additional Comments:  Jolly Mango, RN 01/24/2016, 1:56 PM

## 2016-01-24 NOTE — Progress Notes (Addendum)
PULMONARY / CRITICAL CARE MEDICINE   Name: Charles Barton MRN: [id number] DOB: Jul 10, 1941    Addendum 01/24/16; 5pm: . I spoke with the patient and family, patient notes that his oxygen saturations at baseline are in the mid 75-78 early in the morning, usually down to about 70% by the end of the day. This is typically on oxygen at 3.5 L. The patient notes that he didn't think he would live past New Year's, because his functional status progressively declined over the last few months of the previous year, he previously could drive a car and walk around [place], currently he uses a riding chair, the family also notes that he is been very short of breath with simple activities around the house such as standing and brushing his teeth or walking from one room to the other, which typically wipes him out. I discussed with the family and the patient that is very low oxygen saturations are symbolic of end-stage lung disease, he may not be able to discharge him home on a normal amount of oxygen.  They appear to understand that he has severe disease, we discussed the possibility of hospice and palliative care, as I think these may be the best choice if we have to get him home and keep them out of the hospital long-term. I discussed with his son that I think his remaining life span is likely weeks to months, provided we are able to get him out of the hospital.   ADMISSION DATE:  01/26/2016 CONSULTATION DATE:  01/16/2016  REFERRING MD:  4/23//17  CHIEF COMPLAINT:  Acute Respiratory distress  HISTORY OF PRESENT ILLNESS:   75 yo white male with acute and severe resp failure, patient on biPAP fio2 100% Increased WOB, increased SOB, using accessory muscles to breathe  Patient is alert/awake for now and I have confirmed DNR/DNI status with patient and family   SUBJECTIVE:   Patient states that he feels much better today and his left sided pain has eased a lot. He is on HFNC  VITAL SIGNS: BP 145/111 mmHg   Pulse 116  Temp(Src) 97.7 F (36.5 C) (Oral)  Resp 21  Ht _0  (1.778 m)  Wt 165 lb 2 oz (74.9 kg)  BMI 23.69 kg/m2  SpO2 92%  HEMODYNAMICS:    VENTILATOR SETTINGS: Vent Mode:  [-]  FiO2 (%):  [88 %-100 %] 88 %  INTAKE / OUTPUT: I/O last 3 completed shifts: In: 4503 [P.O.:720; I.V.:6; Other:240; IV Piggyback:375] Out: -   PHYSICAL EXAMINATION: General:  Elderly, sickly appearing white male Neuro:  Awake, alert, oriented, follows command. HEENT:  Atraumatic , normocephalic, no JVD appreciated Cardiovascular:  S1S2, RRR, no MRG noted Lungs:  Diminished bibasilar L> R, no wheezes, crackles, rhonchi noted. Abdomen: soft, nondistended, non tender Musculoskeletal:  No inflammation, deformity noted Skin:  Grossly intact  LABS:  BMET  Recent Labs Lab 01/16/2016 1453 01/24/16 0437  NA 142 143  K 4.8 4.2  CL 111 113*  CO2 20* 20*  BUN 28* 28*  CREATININE 1.26* 1.05  GLUCOSE 132* 182*    Electrolytes  Recent Labs Lab 01/29/2016 1453 01/24/16 0437  CALCIUM 9.1 8.7*  MG 2.2  --   PHOS 3.2  --     CBC  Recent Labs Lab 01/29/2016 1453 01/24/16 0437  WBC 13.2* 8.4  HGB 16.5 13.9  HCT 51.1 43.0  PLT 139* 108*    Coag's No results for input(s): APTT, INR in the last 168 hours.  Sepsis Markers  Recent Labs Lab 01/28/2016 1611 01/10/2016 1853  LATICACIDVEN 2.2* 3.3*    ABG  Recent Labs Lab 01/05/2016 1620 01/24/16 0526  PHART 7.39 7.41  PCO2ART 29* 30*  PO2ART 68* 51*    Liver Enzymes  Recent Labs Lab 01/18/2016 1453  AST 31  ALT 12*  ALKPHOS 72  BILITOT 2.4*  ALBUMIN 4.0    Cardiac Enzymes  Recent Labs Lab 01/01/2016 1453  TROPONINI 0.16*    Glucose No results for input(s): GLUCAP in the last 168 hours.  Imaging Dg Chest Port 1 View  01/24/2016  CLINICAL DATA:  Respiratory failure. EXAM: PORTABLE CHEST 1 VIEW COMPARISON:  01/03/2016 .  12/15/ 2015. FINDINGS: Mediastinum and hilar structures are normal. Cardiomegaly with normal  pulmonary vascularity. Bilateral severe changes of bullous COPD. Persistent left upper lobe and bibasilar atelectasis and/or infiltrates. No change. Small left pleural effusion . IMPRESSION: 1. Persistent left upper lobe and bibasilar atelectasis and/or infiltrates. Small left pleural effusion. Continued follow-up exam suggested to demonstrate clearing to exclude underlying focal lesion. 2. Severe bullous COPD. 3. Stable cardiomegaly. Electronically Signed   By: Marcello Moores  Register   On: 01/24/2016 07:23   Dg Chest Port 1 View  01/18/2016  CLINICAL DATA:  Chest pain and shortness of breath EXAM: PORTABLE CHEST 1 VIEW COMPARISON:  09/15/2014 chest radiograph. FINDINGS: Stable cardiomediastinal silhouette with normal heart size. No pneumothorax. Possible small left pleural effusion. No appreciable right pleural effusion. Advanced emphysema. Mild patchy left upper parahilar lung opacity. Patchy opacity at the left greater than right lung bases. IMPRESSION: 1. Mild patchy left upper parahilar and bibasilar lung opacities, worrisome for multifocal pneumonia . 2. Advanced emphysema. 3. Possible small left pleural effusion. Electronically Signed   By: Ilona Sorrel M.D.   On: 01/13/2016 15:17     STUDIES:  none  CULTURES:  4/23 MRSA pcr negative  ANTIBIOTICS: Vancomycin/merepenem 4/23 >>>  SIGNIFICANT EVENTS: none  LINES/TUBES: none  DISCUSSION: 75 yo male presents with shortness of breath and left sided infilterates and severe bullous emphysema. ASSESSMENT / PLAN:  PULMONARY A: Acute on chronic respiratory failure COPD P:   Continue Albuterol/ pulmicort/ duoneb BiPAP/HFNC as needed, as tolerated Started on ceftriaxone and Azitromycin Discontine Vancomycin, MRSA negative Continue steroids CARDIOVASCULAR A:  Hypertension, hyperlipidemia Hx of CAD P:  Management per primary   RENAL A:   No active issues P:   Monitor i/o Replace lytes  GASTROINTESTINAL A:   No active issues P:    On HH diet  HEMATOLOGIC A:   No active issues P:  SCDS lovenox for DVT prophylaxis  INFECTIOUS A:   Left sided PNA P:   Continue ceftriaxone/azithromycin Follow cultures  ENDOCRINE A:   No  Active issues  P:   Blood sugar checks intermittently NEUROLOGIC A:   No active issues P:   RASS goal: 0     Bincy Varughese,AG-ACNP Pulmonary & Critical Care  The patient was seen with nurse practitioner and I agree with the assessment and plan as above were amended as needed. -The patient is a 75 year Barton male with baseline severe bullous emphysema with baseline chronic respiratory failure. He presents now with acute respiratory failure, on my review of his most recent chest x-ray films. Neck. In comparison with Barton films. There is a right mid zone pneumonia, likely the cause of his acute respiratory failure. Will continue broad-spectrum antibiotics, patient currently is on 90% high flow oxygen, will continue this as tolerated and use BiPAP as needed.  Deep Curly Rim.D.  01/24/2016 Critical Care Attestation.  I have personally obtained a history, examined the patient, evaluated laboratory and imaging results, formulated the assessment and plan and placed orders. The Patient requires high complexity decision making for assessment and support, frequent evaluation and titration of therapies, application of advanced monitoring technologies and extensive interpretation of multiple databases. The patient has critical illness that could lead imminently to failure of 1 or more organ systems and requires the highest level of physician preparedness to intervene.  Critical Care Time devoted to patient care services described in this note is 35 minutes and is exclusive of time spent in procedures.

## 2016-01-24 NOTE — Progress Notes (Signed)
Pharmacy Antibiotic Note  Charles Barton is a 75 y.o. male admitted on 01/13/2016 with pneumonia.  Pharmacy has been consulted for ceftriaxone dosing.  This is day #2 of antibiotics and patient was prescribed azithromycin, vancomycin, and meropenem. Antibiotics are being changed to azithromycin + ceftriaxone.  Plan: Order ceftriaxone 1 g IV daily Patient also has azithromycin 250 mg IV daily ordered  Height: _0  (177.8 cm) Weight: 165 lb 2 oz (74.9 kg) IBW/kg (Calculated) : 73  Temp (24hrs), Avg:97.9 F (36.6 C), Min:97.7 F (36.5 C), Max:98.1 F (36.7 C)   Recent Labs Lab 01/15/2016 1453 01/18/2016 1611 01/29/2016 1853 01/24/16 0437  WBC 13.2*  --   --  8.4  CREATININE 1.26*  --   --  1.05  LATICACIDVEN  --  2.2* 3.3*  --     Estimated Creatinine Clearance: 64.7 mL/min (by C-G formula based on Cr of 1.05).    Allergies  Allergen Reactions  . No Known Allergies    Antimicrobials this admission: meropenem 4/23 >>  vancomycin 4/23 >>  Azithromycin 4/13 >> Ceftriaxone 4/13 >>  Dose adjustments this admission: 4/24 antibiotics changed from vancomycin/meropenem/azithromycin to azithromycin + ceftriaxone  Microbiology results: 4/23 BCx: Sent 4/23 MRSA PCR: negative  Thank you for allowing pharmacy to be a part of this patient's care.  Lenis Noon, PharmD Clinical Pharmacist 01/24/2016 12:01 PM

## 2016-01-24 NOTE — Clinical Documentation Improvement (Signed)
Critical Care  Can the diagnosis of Malnutrition be further specified?   Document Severity - Severe(third degree), Moderate (second degree), Mild (first degree)  Other condition  Unable to clinically determine  Document any associated diagnoses/conditions Supporting Information: :  01/24/2016 Nutritional Management:  Severe malnutrition in context of chronic illness as evidenced by severe depletion of body fat, severe depletion of muscle mass.     Please exercise your independent, professional judgment when responding. A specific answer is not anticipated or expected. Please update your documentation within the medical record to reflect your response to this query. Thank you  Thank You, [place] [phone]

## 2016-01-24 NOTE — Progress Notes (Signed)
Order for enoxaparin 30 mg subcutaneously daily for DVT prophylaxis was changed to 40 mg daily per anticoagulation protocol for CrCl > 30 mL/min.  Charles Barton, PharmD 12:58 PM

## 2016-01-24 NOTE — Progress Notes (Signed)
Patient transported to CT and back on non-re breather with [place], NT.  Vital signs stable throughout, no distress noted.  Patient back to ICU-7, will continue to monitor.

## 2016-01-25 DIAGNOSIS — E43 Unspecified severe protein-calorie malnutrition: Secondary | ICD-10-CM | POA: Insufficient documentation

## 2016-01-25 LAB — BLOOD GAS, ARTERIAL
ACID-BASE DEFICIT: 2.8 mmol/L — AB (ref 0.0–2.0)
Bicarbonate: 21.9 mEq/L (ref 21.0–28.0)
FIO2: 70
O2 SAT: 86.9 %
PCO2 ART: 37 mmHg (ref 32.0–48.0)
PH ART: 7.38 (ref 7.350–7.450)
PO2 ART: 54 mmHg — AB (ref 83.0–108.0)
Patient temperature: 37

## 2016-01-25 LAB — BASIC METABOLIC PANEL
ANION GAP: 9 (ref 5–15)
BUN: 31 mg/dL — AB (ref 6–20)
CALCIUM: 8.9 mg/dL (ref 8.9–10.3)
CO2: 22 mmol/L (ref 22–32)
CREATININE: 0.93 mg/dL (ref 0.61–1.24)
Chloride: 111 mmol/L (ref 101–111)
GFR calc Af Amer: >60 mL/min (ref >60)
GLUCOSE: 120 mg/dL — AB (ref 65–99)
Potassium: 4.5 mmol/L (ref 3.5–5.1)
SODIUM: 142 mmol/L (ref 135–145)

## 2016-01-25 LAB — CBC
HCT: 42.9 % (ref 40.0–52.0)
Hemoglobin: 14 g/dL (ref 13.0–18.0)
MCH: 27 pg (ref 26.0–34.0)
MCHC: 32.6 g/dL (ref 32.0–36.0)
MCV: 82.7 fL (ref 80.0–100.0)
PLATELETS: 128 10*3/uL — AB (ref 150–440)
RBC: 5.19 MIL/uL (ref 4.40–5.90)
RDW: 20.5 % — AB (ref 11.5–14.5)
WBC: 9.6 10*3/uL (ref 3.8–10.6)

## 2016-01-25 MED ORDER — SODIUM CHLORIDE 0.9 % IV SOLN
1.0000 g | Freq: Two times a day (BID) | INTRAVENOUS | Status: DC
  Filled 2016-01-25: qty 1

## 2016-01-25 NOTE — Progress Notes (Signed)
Palliative Care Update  I have met briefly with pt and family.  The plan is for me to talk with pt further a little later today (since several family members are in the room shaving him right now).  They would like to have all the family in a meeting and this is to be done TOMORROW at 11 am.  Full note to follow as I get more involved in this process of discussing various options and scenarios.  Colleen Can, MD

## 2016-01-25 NOTE — Progress Notes (Signed)
Patient ID: Charles Barton, male   DOB: Apr 25, 1941, 75 y.o.   MRN: [id number] ICU signed out case to medical service. Since they saw the patient today and we will take over care tomorrow. Dr. Loletha Grayer

## 2016-01-25 NOTE — Progress Notes (Signed)
Patient has rested well throughout the night, he has remained on the Hi-Flow [place] with O2 sats 85-90%.  Patient voided in the [facility] and all other vital signs have remained stable.  Will continue to monitor.

## 2016-01-25 NOTE — Progress Notes (Signed)
Pharmacy Antibiotic Note  Charles Barton is a 75 y.o. male admitted on 01/08/2016 with pneumonia.  Pharmacy has been consulted for ceftriaxone dosing.  This is day #3 of antibiotics. Currently on ceftriaxone and azithromycin.   Plan: Continue ceftriaxone 1 g IV daily Continue azithromycin 250 mg IV daily  Height: 5' 10" (177.8 cm) Weight: 164 lb 14.5 oz (74.8 kg) IBW/kg (Calculated) : 73  Temp (24hrs), Avg:97.9 F (36.6 C), Min:97.6 F (36.4 C), Max:98.1 F (36.7 C)   Recent Labs Lab 01/27/2016 1453 01/22/2016 1611 01/22/2016 1853 01/24/16 0437 01/25/16 0459  WBC 13.2*  --   --  8.4 9.6  CREATININE 1.26*  --   --  1.05 0.93  LATICACIDVEN  --  2.2* 3.3*  --   --     Estimated Creatinine Clearance: 73 mL/min (by C-G formula based on Cr of 0.93).    Allergies  Allergen Reactions  . No Known Allergies    Antimicrobials this admission: meropenem 4/23 >>  vancomycin 4/23 >>  Azithromycin 4/13 >> Ceftriaxone 4/13 >>  Dose adjustments this admission: 4/24 antibiotics changed from vancomycin/meropenem/azithromycin to azithromycin + ceftriaxone  Microbiology results: 4/23 BCx: Sent 4/23 MRSA PCR: negative  Thank you for allowing pharmacy to be a part of this patient's care.  Lenis Noon, PharmD Clinical Pharmacist 01/25/2016 12:07 PM

## 2016-01-25 NOTE — Progress Notes (Signed)
PULMONARY / CRITICAL CARE MEDICINE   Name: Charles Barton MRN: [id number] DOB: July 30, 1941      ADMISSION DATE:  01/19/2016 CONSULTATION DATE:  01/30/2016  REFERRING MD:  4/23//17  CHIEF COMPLAINT:  Acute Respiratory distress  HISTORY OF PRESENT ILLNESS:   75 yo white male with acute and severe resp failure, patient on high flow nasal cannula oxygen   SUBJECTIVE:  Patient has no new complaints today, his breathing is a bit better today.  VITAL SIGNS: BP 129/92 mmHg  Pulse 117  Temp(Src) 98.5 F (36.9 C) (Oral)  Resp 19  Ht _0  (1.778 m)  Wt 164 lb 14.5 oz (74.8 kg)  BMI 23.66 kg/m2  SpO2 90%  HEMODYNAMICS:    VENTILATOR SETTINGS: Vent Mode:  [-]  FiO2 (%):  [70 %-81 %] 70 %  INTAKE / OUTPUT: I/O last 3 completed shifts: In: 2444 [P.O.:1320; I.V.:409; Other:240; IV Piggyback:475] Out: 62 [Urine:425]  PHYSICAL EXAMINATION: General:  Elderly, sickly appearing white male Neuro:  Awake, alert, oriented, follows command. HEENT:  Atraumatic , normocephalic, no JVD appreciated Cardiovascular:  S1S2, RRR, no MRG noted Lungs:  Diminished bibasilar L> R, no wheezes, crackles, rhonchi noted. Abdomen: soft, nondistended, non tender Musculoskeletal:  No inflammation, deformity noted Skin:  Grossly intact  LABS:  BMET  Recent Labs Lab 01/16/2016 1453 01/24/16 0437 01/25/16 0459  NA 142 143 142  K 4.8 4.2 4.5  CL 111 113* 111  CO2 20* 20* 22  BUN 28* 28* 31*  CREATININE 1.26* 1.05 0.93  GLUCOSE 132* 182* 120*    Electrolytes  Recent Labs Lab 01/05/2016 1453 01/24/16 0437 01/25/16 0459  CALCIUM 9.1 8.7* 8.9  MG 2.2  --   --   PHOS 3.2  --   --     CBC  Recent Labs Lab 01/19/2016 1453 01/24/16 0437 01/25/16 0459  WBC 13.2* 8.4 9.6  HGB 16.5 13.9 14.0  HCT 51.1 43.0 42.9  PLT 139* 108* 128*    Coag's No results for input(s): APTT, INR in the last 168 hours.  Sepsis Markers  Recent Labs Lab 01/02/2016 1611 01/14/2016 1853  LATICACIDVEN  2.2* 3.3*    ABG  Recent Labs Lab 01/09/2016 1620 01/24/16 0526 01/25/16 0500  PHART 7.39 7.41 7.38  PCO2ART 29* 30* 37  PO2ART 68* 51* 54*    Liver Enzymes  Recent Labs Lab 01/20/2016 1453  AST 31  ALT 12*  ALKPHOS 72  BILITOT 2.4*  ALBUMIN 4.0    Cardiac Enzymes  Recent Labs Lab 01/28/2016 1453  TROPONINI 0.16*    Glucose No results for input(s): GLUCAP in the last 168 hours.  Imaging Ct Chest Wo Contrast  01/25/2016  CLINICAL DATA:  Acute onset of respiratory failure. Personal history of COPD. Initial encounter. EXAM: CT CHEST WITHOUT CONTRAST TECHNIQUE: Multidetector CT imaging of the chest was performed following the standard protocol without IV contrast. COMPARISON:  Chest radiograph performed earlier today at 5:55 a.m. FINDINGS: Significant bilateral emphysematous change is noted, more prominent at the upper lung zones, with scattered scarring and minimal airspace opacity noted at the lower lung zones. Mild superimposed infection cannot be excluded. There may also be focal airspace opacity at the left lung apex. No pleural effusion or pneumothorax is seen. Diffuse coronary artery calcifications are seen. The mediastinum is grossly unremarkable. No mediastinal lymphadenopathy is seen. No pericardial effusion is identified. The great vessels are grossly unremarkable. The thyroid gland is unremarkable. No axillary lymphadenopathy is seen. The visualized portions of the  liver and spleen are unremarkable. An abdominal aortic stent is noted. Stones are noted within the gallbladder. The visualized portions of the pancreas and adrenal glands are within normal limits. No acute osseous abnormalities are identified. IMPRESSION: 1. Minimal airspace opacity in the lower lung zones, and question of focal airspace opacity at the left lung apex, which may reflect mild superimposed infection. 2. Underlying significant bilateral emphysematous change, more prominent at the upper lung zones,  with scarring at the lower lung zones. 3. Diffuse coronary artery calcifications seen. 4. Cholelithiasis. Electronically Signed   By: Garald Balding M.D.   On: 01/25/2016 02:34     STUDIES:  none  CULTURES:  4/23 MRSA pcr negative  ANTIBIOTICS: Vancomycin/merepenem 4/23 >>>  SIGNIFICANT EVENTS: none  LINES/TUBES: none  DISCUSSION: 75 yo male presents with shortness of breath and left sided infilterates and severe bullous emphysema. He has severe end-stage disease, and has been managing at home, his oxygen saturation frequently in the 70-80% range    ASSESSMENT / PLAN:  PULMONARY A: Acute on chronic respiratory failure COPD-review of CT scan performed overnight showed continued severe bullous emphysema with only slight degradation from previous CT scan in 2015, but still extremely severe lung disease. P:   Continue Albuterol/ pulmicort/ duoneb Wean HFNC  as tolerated Continue on ceftriaxone and Azitromycin Discussed with patient and family, we will likely need to transition to hospice, palliative care service has been consulted regarding the transition to outpatient setting, potentially with hospice.  Continue steroids   CARDIOVASCULAR A:  Hypertension, hyperlipidemia Hx of CAD P:  Management per primary   RENAL A:   No active issues P:   Monitor i/o Replace lytes  GASTROINTESTINAL A:   No active issues P:   On HH diet  HEMATOLOGIC A:   No active issues P:  SCDS lovenox for DVT prophylaxis  INFECTIOUS A:   Left sided PNA P:   Continue ceftriaxone/azithromycin Follow cultures  ENDOCRINE A:   No  Active issues  P:   Blood sugar checks intermittently NEUROLOGIC A:   No active issues P:   RASS goal: 0    Deep Deania Siguenza M.D. 01/25/2016 Critical Care Attestation.  I have personally obtained a history, examined the patient, evaluated laboratory and imaging results, formulated the assessment and plan and placed orders. The Patient  requires high complexity decision making for assessment and support, frequent evaluation and titration of therapies, application of advanced monitoring technologies and extensive interpretation of multiple databases. The patient has critical illness that could lead imminently to failure of 1 or more organ systems and requires the highest level of physician preparedness to intervene.  Critical Care Time devoted to patient care services described in this note is 35 minutes and is exclusive of time spent in procedures.

## 2016-01-25 NOTE — Care Management (Addendum)
Patient is currently on HFNC.   Patient is DNR.  There is discussion about moving out of icu but is reported that "patient can not move out of icu to the floor on current 02 percentage" even if has a DNR in place.  Will investigate .  Palliative care to consult today for assist with decreasing 02 requirements so can move out of icu.  There is documentation from previous CM assessment indicating that patient relayed he would not allow home health services.  It is hoped that patient will reconsider.

## 2016-01-25 NOTE — Consult Note (Signed)
Palliative Medicine Inpatient Consult Note   Name: Charles Barton Date: 01/25/2016 MRN: [id number]  DOB: 1940-11-20  Referring Physician: Flora Lipps, MD  Palliative Care consult requested for this 75 y.o. male for goals of medical therapy in patient with acute on chronic respiratory failure.  TODAY'S DISCUSSIONS: Pt is DNR Pt and family have many questions and expectations. I have heard some of their discussions with pt and am aware that all including pt are likely to want to pursue 'whatever it takes' to get him home.  I am not sure that they are going to want to talk about any kind of comfort care, though they may be open to having hospice services in the home.  Family will all be here tomorrow and we will meet at 11 am to talk about options.   CLINICAL NARRATIVE: Pt is 88 you man with h/o COPD usually on 4 LPM O2 continuously at home, who came to the ED with left sided chest discomfort and increased dyspnea.  He reported that his baseline O2 sats is normally around 88%.  Here, he had a sat of 49% and further work up Revealed a patchy LUL multifocal pneumonia along with a small left pleural effusion. He was treated with a nonrebreather and sats came up to 77%.  Then he later was on BIPAP and DNR/ DNI status was confirmed. Then he was changed over to HI Flow oxygen.  He was at too high a level of Hi Flow to go to the floor.  His sats have been good at current hi level of HI flow.  Critical Care physicians have spoken with family and they let them know that pt's low O2 sats are indicative of a very end stage process.  They are not sure he will ever be able to get down to a 'normal amount' of O2.  A Palliative Care consult was requested.    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  He says he is doing 'ok' at current O2 settings for Hi Flow.  He has no pain now.   SPIRITUAL SUPPORT SYSTEM: Yes  .  SOCIAL HISTORY:  reports that he quit smoking about 9 years ago. He has never used smokeless tobacco. He reports that he does not drink alcohol or use illicit drugs.  Lives at home with wife and has several adult children and extended family members visiting and involved in his care.  Pt has [employer] which is supplying Oxygen at 4 LPM nasal cannula.   LEGAL DOCUMENTS:  I have completed a portable DNR form for pt and placed it in the paper chart.  CODE STATUS: DNR adn DNI  PAST MEDICAL HISTORY: Past Medical History  Diagnosis Date  . Abdominal aneurysm ([place])   . 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 [facility])     prostate  . Hypertension   . Hyperlipidemia   . CAD (coronary artery disease)   . COPD (chronic obstructive pulmonary disease) ([place])   . AAA (abdominal aortic aneurysm) ([place])   . 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 Aortic atherosclerosis ([facility])     PAST SURGICAL HISTORY:  Past Surgical History  Procedure Laterality Date  . Abdominal aortic aneurysm repair  2014  . Femoral artery aneurysm repair  2014  . Femoral artery aneurysm repair    . Abdominal aortic aneurysm repair      ALLERGIES:  is allergic to no known allergies.  MEDICATIONS:  Current Facility-Administered Medications  Medication Dose Route Frequency Provider Last Rate Last Dose  . 0.9 %  sodium chloride infusion  250 mL Intravenous PRN Flora Lipps, MD 20 mL/hr at 01/24/16 1600 250 mL at 01/24/16 1600  . 0.9 %  sodium chloride infusion  250 mL Intravenous PRN Flora Lipps, MD 20 mL/hr at 01/24/16 2000 250 mL at 01/24/16 2000  . acetaminophen (TYLENOL) tablet 650 mg  650 mg Oral Q4H PRN Flora Lipps, MD      . albuterol (PROVENTIL) (2.5 MG/3ML)  0.083% nebulizer solution 2.5 mg  2.5 mg Nebulization Q4H Flora Lipps, MD   2.5 mg at 01/25/16 1131  . antiseptic oral rinse (CPC / CETYLPYRIDINIUM CHLORIDE 0.05%) solution 7 mL  7 mL Mouth Rinse BID Mikael Spray, NP   7 mL at 01/24/16 2200  . azithromycin (ZITHROMAX) 250 mg in dextrose 5 % 125 mL IVPB  250 mg Intravenous Q24H Flora Lipps, MD   250 mg at 01/24/16 1805  . budesonide (PULMICORT) nebulizer solution 0.5 mg  0.5 mg Nebulization BID Flora Lipps, MD   0.5 mg at 01/25/16 0735  . cefTRIAXone (ROCEPHIN) 1 g in dextrose 5 % 50 mL IVPB  1 g Intravenous Q24H Lenis Noon, RPH   1 g at 01/25/16 1310  . enoxaparin (LOVENOX) injection 40 mg  40 mg Subcutaneous Q24H Lenis Noon, RPH   40 mg at 01/24/16 2029  . famotidine (PEPCID) IVPB 20 mg premix  20 mg Intravenous Q12H Flora Lipps, MD   20 mg at 01/25/16 1054  . feeding supplement (ENSURE ENLIVE) (ENSURE ENLIVE) liquid 237 mL  237 mL Oral TID BM Laverle Hobby, MD   237 mL at 01/25/16 1310  . methylPREDNISolone sodium succinate (SOLU-MEDROL) 40 mg/mL injection 40 mg  40 mg Intravenous Q12H Flora Lipps, MD   40 mg at 01/25/16 0512  . mometasone-formoterol (DULERA) 100-5 MCG/ACT inhaler 2 puff  2 puff Inhalation BID Flora Lipps, MD   2 puff at 01/25/16 0823  . ondansetron (ZOFRAN) injection 4 mg  4 mg Intravenous Q6H PRN Flora Lipps, MD      . sodium chloride flush (NS) 0.9 % injection 3 mL  3 mL Intravenous Q12H Flora Lipps, MD   3 mL at 01/25/16 1053  . sodium chloride flush (NS) 0.9 % injection 3 mL  3 mL Intravenous PRN Flora Lipps, MD      . tiotropium (SPIRIVA) inhalation capsule 18 mcg  18 mcg Inhalation Daily Flora Lipps, MD   18 mcg at 01/25/16 0824    Vital Signs: BP 129/92 mmHg  Pulse 117  Temp(Src) 98.5 F (36.9 C) (Oral)  Resp 19  Ht _0  (1.778 m)  Wt 74.8 kg (164 lb 14.5 oz)  BMI 23.66 kg/m2  SpO2 90% Filed Weights   01/24/2016 1847 01/24/16 0450 01/25/16 0615  Weight: 71.4 kg (157 lb 6.5 oz) 74.9 kg (165 lb  2 oz) 74.8 kg (164 lb 14.5 oz)    Estimated body mass index is 23.66 kg/(m^2) as calculated from the following:   Height as of this encounter: _1  (1.778 m).   Weight as of this encounter: 74.8 kg (164 lb  14.5 oz).  PERFORMANCE STATUS (ECOG) : 4 - Bedbound  PHYSICAL EXAM: Pt is alert on Hi Flow O2 45 LPM 65% EOMI OP clear Neck no JVD or TM Hrt rrr tachy with SVT and ST and BBB Lungs distant BS   LABS: CBC:    Component Value Date/Time   WBC 9.6 01/25/2016 0459   WBC 7.6 11/04/2015 1055   WBC 9.3 01/15/2015 0918   HGB 14.0 01/25/2016 0459   HGB 17.2 01/15/2015 0918   HCT 42.9 01/25/2016 0459   HCT 51.0 11/04/2015 1055   HCT 52.4* 01/15/2015 0918   PLT 128* 01/25/2016 0459   PLT 154 11/04/2015 1055   PLT 182 01/15/2015 0918   MCV 82.7 01/25/2016 0459   MCV 85 11/04/2015 1055   MCV 92 01/15/2015 0918   NEUTROABS 6.4 12/17/2015 0925   NEUTROABS 5.1 11/04/2015 1055   NEUTROABS 5.9 01/15/2015 0918   LYMPHSABS 1.0 12/17/2015 0925   LYMPHSABS 1.4 11/04/2015 1055   LYMPHSABS 2.1 01/15/2015 0918   MONOABS 1.0 12/17/2015 0925   MONOABS 0.9 01/15/2015 0918   EOSABS 0.1 12/17/2015 0925   EOSABS 0.2 11/04/2015 1055   EOSABS 0.3 01/15/2015 0918   BASOSABS 0.2* 12/17/2015 0925   BASOSABS 0.1 11/04/2015 1055   BASOSABS 0.1 01/15/2015 0918   Comprehensive Metabolic Panel:    Component Value Date/Time   NA 142 01/25/2016 0459   NA 143 11/04/2015 1055   NA 144 10/21/2014 0909   K 4.5 01/25/2016 0459   K 4.0 10/21/2014 0909   CL 111 01/25/2016 0459   CL 106 10/21/2014 0909   CO2 22 01/25/2016 0459   CO2 30 10/21/2014 0909   BUN 31* 01/25/2016 0459   BUN 21 11/04/2015 1055   BUN 15 10/21/2014 0909   CREATININE 0.93 01/25/2016 0459   CREATININE 1.37* 10/21/2014 0909   GLUCOSE 120* 01/25/2016 0459   GLUCOSE 90 11/04/2015 1055   GLUCOSE 71 10/21/2014 0909   CALCIUM 8.9 01/25/2016 0459   CALCIUM 8.7 10/21/2014 0909   AST 31 01/12/2016 1453   AST 18 09/09/2013 0410    ALT 12* 01/12/2016 1453   ALT 28 09/09/2013 0410   ALKPHOS 72 01/06/2016 1453   ALKPHOS 51 09/09/2013 0410   BILITOT 2.4* 01/08/2016 1453   BILITOT 1.3* 11/04/2015 1055   BILITOT 0.9 09/09/2013 0410   PROT 7.7 01/19/2016 1453   PROT 7.2 11/04/2015 1055   PROT 5.2* 09/09/2013 0410   ALBUMIN 4.0 01/14/2016 1453   ALBUMIN 4.3 11/04/2015 1055   ALBUMIN 2.5* 09/09/2013 0410   CT chest wo CM 4/24: 1. Minimal airspace opacity in the lower lung zones, and question of focal airspace opacity at the left lung apex, which may reflect mild superimposed infection. 2. Underlying significant bilateral emphysematous change, more prominent at the upper lung zones, with scarring at the lower lung zones. 3. Diffuse coronary artery calcifications seen. 4. Cholelithiasis.   ABG today =7.38/ 37/54/ 22 on 45 LPM and 65% FIO2 Sat was 88% on this setting at 4 pm today.  Sats were higher on same setting earlier.     cxr 4/24: 1. Persistent left upper lobe and bibasilar atelectasis and/or infiltrates. Small left pleural effusion. Continued follow-up exam suggested to demonstrate clearing to exclude underlying focal lesion. 2. Severe bullous COPD. 3. Stable cardiomegaly.  ECHO 12/16/15: Left ventricle: The cavity size was normal. Systolic function was  normal. The estimated ejection fraction was in the range of 55%  to 65%. Wall motion was normal;  there were no regional wall  motion abnormalities. Doppler parameters are consistent with  abnormal left ventricular relaxation (grade 1 diastolic  dysfunction). - Left atrium: The atrium was normal in size. - Right ventricle: The cavity size was severely dilated. Wall  thickness was mildly increased. Systolic function was mildly  reduced. - Right atrium: The atrium was moderately dilated. - Pulmonary arteries: Systolic pressure was severely elevated. PA  peak pressure: 79 mm Hg (S). - Inferior vena cava: The vessel was dilated. The  respirophasic  diameter changes were blunted (< 50%), consistent with elevated  central venous pressure.   More than 50% of the visit was spent in counseling/coordination of care: Yes  Time Spent: 80 minutes

## 2016-01-26 ENCOUNTER — Telehealth: Payer: Self-pay

## 2016-01-26 DIAGNOSIS — R0902 Hypoxemia: Secondary | ICD-10-CM | POA: Insufficient documentation

## 2016-01-26 DIAGNOSIS — D751 Secondary polycythemia: Secondary | ICD-10-CM

## 2016-01-26 DIAGNOSIS — J439 Emphysema, unspecified: Secondary | ICD-10-CM

## 2016-01-26 DIAGNOSIS — Z87891 Personal history of nicotine dependence: Secondary | ICD-10-CM

## 2016-01-26 DIAGNOSIS — I10 Essential (primary) hypertension: Secondary | ICD-10-CM

## 2016-01-26 DIAGNOSIS — J9621 Acute and chronic respiratory failure with hypoxia: Secondary | ICD-10-CM

## 2016-01-26 DIAGNOSIS — I272 Other secondary pulmonary hypertension: Secondary | ICD-10-CM

## 2016-01-26 DIAGNOSIS — J9811 Atelectasis: Secondary | ICD-10-CM

## 2016-01-26 DIAGNOSIS — Z79899 Other long term (current) drug therapy: Secondary | ICD-10-CM

## 2016-01-26 DIAGNOSIS — Z9981 Dependence on supplemental oxygen: Secondary | ICD-10-CM

## 2016-01-26 DIAGNOSIS — I2510 Atherosclerotic heart disease of native coronary artery without angina pectoris: Secondary | ICD-10-CM

## 2016-01-26 DIAGNOSIS — Z515 Encounter for palliative care: Secondary | ICD-10-CM

## 2016-01-26 DIAGNOSIS — J9622 Acute and chronic respiratory failure with hypercapnia: Secondary | ICD-10-CM

## 2016-01-26 LAB — ALBUMIN: ALBUMIN: 3.2 g/dL — AB (ref 3.5–5.0)

## 2016-01-26 MED ORDER — LORAZEPAM 0.5 MG PO TABS
0.5000 mg | ORAL_TABLET | ORAL | Status: DC | PRN
  Administered 2016-01-26: 15:00:00 0.5 mg via ORAL
  Filled 2016-01-26: qty 1

## 2016-01-26 MED ORDER — NYSTATIN 100000 UNIT/ML MT SUSP
5.0000 mL | Freq: Four times a day (QID) | OROMUCOSAL | Status: DC
  Administered 2016-01-26 – 2016-01-31 (×20): 500000 [IU] via ORAL
  Filled 2016-01-26 (×20): qty 5

## 2016-01-26 MED ORDER — FAMOTIDINE 20 MG PO TABS
20.0000 mg | ORAL_TABLET | Freq: Every day | ORAL | Status: DC
  Administered 2016-01-26 – 2016-01-31 (×6): 20 mg via ORAL
  Filled 2016-01-26 (×7): qty 1

## 2016-01-26 MED ORDER — MORPHINE SULFATE (CONCENTRATE) 10 MG/0.5ML PO SOLN
2.6000 mg | Freq: Four times a day (QID) | ORAL | Status: DC
  Administered 2016-01-26 – 2016-01-27 (×4): 2.6 mg via ORAL
  Filled 2016-01-26 (×4): qty 1

## 2016-01-26 MED ORDER — BISACODYL 10 MG RE SUPP: 10.0000 mg | Freq: Every day | RECTAL | Status: DC | PRN

## 2016-01-26 MED ORDER — MORPHINE SULFATE (CONCENTRATE) 10 MG/0.5ML PO SOLN
5.0000 mg | ORAL | Status: DC | PRN
  Administered 2016-01-30 – 2016-01-31 (×2): 5 mg via ORAL
  Filled 2016-01-26 (×2): qty 1

## 2016-01-26 MED ORDER — PROCHLORPERAZINE 25 MG RE SUPP
25.0000 mg | Freq: Three times a day (TID) | RECTAL | Status: DC | PRN
  Filled 2016-01-26: qty 1

## 2016-01-26 MED ORDER — POLYETHYLENE GLYCOL 3350 17 G PO PACK
17.0000 g | PACK | Freq: Every day | ORAL | Status: DC
  Administered 2016-01-26 – 2016-01-31 (×5): 17 g via ORAL
  Filled 2016-01-26 (×7): qty 1

## 2016-01-26 MED ORDER — SENNA 8.6 MG PO TABS: 2.0000 | ORAL_TABLET | Freq: Every evening | ORAL | Status: DC | PRN

## 2016-01-26 NOTE — Progress Notes (Signed)
Patient alert and oriented. HF 35%, oxygenating upper 80's. No complaints of SOB. Tolerating diet and using bedside commode.Being TX to room 106.

## 2016-01-26 NOTE — Progress Notes (Signed)
Palliative Medicine Inpatient Consult Follow Up Note   Name: Charles Barton Date: 01/26/2016 MRN: [id number]  DOB: 24-Mar-1941  Referring Physician: Gladstone Lighter, MD  Palliative Care consult requested for this 75 y.o. male for goals of medical therapy in patient with acute on chronic resp failure.   TODAY'S DISCUSSIONS AND PLANS: I met with the pt, pt's wife, and three of his adult children and one son-in-law.  There were many questions and issues addressed. The outcome of the hour long meeting is that pt would like more than anything to go home.  To accomplish this, his hi flow oxygen needs must be reduced and his body will determine whether or not we are successful in accomplishing this.  Family seemed to be focused on what I was going to do to get patient home and after answering as kindly and diplomatically as I could multiple times, I had to be more blunt.  I had to let them know that we are going to TRY to get his oxygen dosing down to whatever we can supply at home, but there is not a gaurantee that we can accomplish this.  I mentioned that he might not be able to go home because he could possibly pass away here.  BUT we are going to try to very GRADUALLY reduce his HI Flow down to what can be supplied at home.   The following issues are pertinent:  1. Pt is motivated and sometimes does too much and talks too much. He has to be reminded to not talk so much and to not do too much.  He will need to use a bedside commode when transferred to the floor --and probably at home.  He actually did MUCH better with his sats when family left and he quit talking so much.  He says he can get his oxygen needs down to where he can go home so we will see.   2.  He could go home on Hi Flow but at low, low settings. The max that can be provided is 15 LPM at 35%.  At this low level, sometimes it is OK to transfer over to 6 LPM via nasal cannula (without hi flow). BUT sometimes we need to leave it at 15  LPM/ 35%.  If he goes home on this setting, Hospice can rig it together (they agree to Hospice in the home).  Family would have to confirm the electrical capability of the home for this to be handled.  3.  Plan is for me to work with RT and to taper pt's % of O2 down by 5 % every 6 hours (no more frequently).  Once down to 35%, we can start on the liters.  But no tapering at nighttime.    4. Pt wants to go to the floor.  This is ordered.  He wants to see outside and given 6 family members, a bigger room is best.  5. He is NOT comfort care. But he is PALLIATIVE CARE at this time. More to discuss, but lets work this plan and see where it gets [place].  He may be here several more days.  6. Family likes to be doing things. They have already called [employer] about providing hi flow at home and they already called [facility].  I have let them know we will be helping things work out and that they do not need to be doing any more regarding discharge arrangements (other than possibly checking the electricity out for  possible Hi flow operations).    7.  Will set O2 sat minimum at 81%.  Have discussed with pt (who says he can go lower ) and family, attending, nursing, etc.     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 Grayland Ormond for this matter Pt had a 'possible lung mass' seen on CT of chest in May 2016 ----he was to have a follow up chest Ct in 6-8 weeks after this and f/u w/ Dr Humphrey Rolls ----I do not see that this was done but he did have a subsequent CXR that did not show a mass on 03/09/14. ---CXRs suggest follow up to exclude underlying focal  lesion. ---CT of chest here was done w/o CM and does not mention mass.   REVIEW OF SYSTEMS:  Pt says he can tolerate 'really really low oxygen sats' and he can adjust how he does things at home so he can go home with low sats.  CODE STATUS: DNR   PAST MEDICAL HISTORY: Past Medical History  Diagnosis Date  . Abdominal aneurysm ([place])   . 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 Cancer [facility])     prostate  . Hypertension   . Hyperlipidemia   . CAD (coronary artery disease)   . COPD (chronic obstructive pulmonary disease) ([place])   . AAA (abdominal aortic aneurysm) ([place])   . 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 Aortic atherosclerosis ([place])     PAST SURGICAL HISTORY:  Past Surgical History  Procedure Laterality Date  . Abdominal aortic aneurysm repair  2014  . Femoral artery aneurysm repair  2014  . Femoral artery aneurysm repair    . Abdominal aortic aneurysm repair      Vital Signs: BP 121/110 mmHg  Pulse 106  Temp(Src) 97.6 F (36.4 C) (Oral)  Resp 15  Ht _0  (1.778 m)  Wt 72.7 kg (160 lb 4.4 oz)  BMI 23.00 kg/m2  SpO2 91% Filed Weights   01/24/16 0450 01/25/16 0615 01/26/16 0453  Weight: 74.9 kg (165 lb 2 oz) 74.8 kg (164 lb 14.5 oz) 72.7 kg (160 lb 4.4 oz)    Estimated body mass index is 23 kg/(m^2) as calculated from the following:   Height as of this encounter: _1  (1.778 m).   Weight as of this encounter: 72.7 kg (160 lb 4.4 oz).  PHYSICAL EXAM: Sitting on the side of the bed with Hi Flow O2 ni nares --- a bit anxious and using accessory muscles to breath when he is talking EOMI, glasses OP clear Speech nl No JVD or TM Hrt rrr tachy at 113 Lungs with distant BS Abd soft and NT Ext no cyanosis or mottling Alert and fully oriented.   LABS: CBC:    Component Value Date/Time   WBC 9.6 01/25/2016 0459   WBC 7.6 11/04/2015 1055   WBC 9.3 01/15/2015 0918   HGB 14.0 01/25/2016 0459   HGB 17.2 01/15/2015 0918   HCT 42.9 01/25/2016 0459   HCT  51.0 11/04/2015 1055   HCT 52.4* 01/15/2015 0918   PLT 128* 01/25/2016 0459   PLT 154 11/04/2015 1055   PLT 182 01/15/2015 0918   MCV  82.7 01/25/2016 0459   MCV 85 11/04/2015 1055   MCV 92 01/15/2015 0918   NEUTROABS 6.4 12/17/2015 0925   NEUTROABS 5.1 11/04/2015 1055   NEUTROABS 5.9 01/15/2015 0918   LYMPHSABS 1.0 12/17/2015 0925   LYMPHSABS 1.4 11/04/2015 1055   LYMPHSABS 2.1 01/15/2015 0918   MONOABS 1.0 12/17/2015 0925   MONOABS 0.9 01/15/2015 0918   EOSABS 0.1 12/17/2015 0925   EOSABS 0.2 11/04/2015 1055   EOSABS 0.3 01/15/2015 0918   BASOSABS 0.2* 12/17/2015 0925   BASOSABS 0.1 11/04/2015 1055   BASOSABS 0.1 01/15/2015 0918   Comprehensive Metabolic Panel:    Component Value Date/Time   NA 142 01/25/2016 0459   NA 143 11/04/2015 1055   NA 144 10/21/2014 0909   K 4.5 01/25/2016 0459   K 4.0 10/21/2014 0909   CL 111 01/25/2016 0459   CL 106 10/21/2014 0909   CO2 22 01/25/2016 0459   CO2 30 10/21/2014 0909   BUN 31* 01/25/2016 0459   BUN 21 11/04/2015 1055   BUN 15 10/21/2014 0909   CREATININE 0.93 01/25/2016 0459   CREATININE 1.37* 10/21/2014 0909   GLUCOSE 120* 01/25/2016 0459   GLUCOSE 90 11/04/2015 1055   GLUCOSE 71 10/21/2014 0909   CALCIUM 8.9 01/25/2016 0459   CALCIUM 8.7 10/21/2014 0909   AST 31 01/22/2016 1453   AST 18 09/09/2013 0410   ALT 12* 01/01/2016 1453   ALT 28 09/09/2013 0410   ALKPHOS 72 01/27/2016 1453   ALKPHOS 51 09/09/2013 0410   BILITOT 2.4* 01/24/2016 1453   BILITOT 1.3* 11/04/2015 1055   BILITOT 0.9 09/09/2013 0410   PROT 7.7 01/07/2016 1453   PROT 7.2 11/04/2015 1055   PROT 5.2* 09/09/2013 0410   ALBUMIN 3.2* 01/26/2016 0634   ALBUMIN 4.3 11/04/2015 1055   ALBUMIN 2.5* 09/09/2013 0410     More than 50% of the visit was spent in counseling/coordination of care: YES  Time Spent:  65 min

## 2016-01-26 NOTE — Telephone Encounter (Signed)
Call pt

## 2016-01-26 NOTE — Progress Notes (Signed)
Pharmacy Antibiotic Note  Charles Barton is a 75 y.o. male admitted on 01/29/2016 with pneumonia.  Pharmacy has been consulted for ceftriaxone dosing.  This is day #4 of antibiotics. Currently on ceftriaxone and azithromycin.   Plan: Continue ceftriaxone 1 g IV daily Continue azithromycin 250 mg IV daily  Height: _0  (177.8 cm) Weight: 160 lb 4.4 oz (72.7 kg) IBW/kg (Calculated) : 73  Temp (24hrs), Avg:98.1 F (36.7 C), Min:97.6 F (36.4 C), Max:98.5 F (36.9 C)   Recent Labs Lab 01/16/2016 1453 01/17/2016 1611 01/30/2016 1853 01/24/16 0437 01/25/16 0459  WBC 13.2*  --   --  8.4 9.6  CREATININE 1.26*  --   --  1.05 0.93  LATICACIDVEN  --  2.2* 3.3*  --   --     Estimated Creatinine Clearance: 72.7 mL/min (by C-G formula based on Cr of 0.93).    Allergies  Allergen Reactions  . No Known Allergies    Antimicrobials this admission: meropenem 4/23 >> 4/24 vancomycin 4/23 >> 4/24 4/23 cefepime >> 4/23 Azithromycin 4/23 >> Ceftriaxone 4/24 >>  Dose adjustments this admission: 4/24 antibiotics changed from vancomycin/meropenem/azithromycin to azithromycin + ceftriaxone  Microbiology results: 4/23 BCx: NGTD 4/23 MRSA PCR: negative  Thank you for allowing pharmacy to be a part of this patient's care.  Ulice Dash D, PharmD Clinical Pharmacist 01/26/2016 12:03 PM

## 2016-01-26 NOTE — Telephone Encounter (Signed)
Patient currently in ICU  Son states he will need to increase his oxygen capacity machine? [employer] told him you will have to order BUT he sees pulmonology  He wants [place] to call him.

## 2016-01-26 NOTE — Progress Notes (Signed)
[facility] at [place] NAME: Charles Barton    MR#:  [id number]  DATE OF BIRTH:  1941/02/12  SUBJECTIVE:  CHIEF COMPLAINT:   Chief Complaint  Patient presents with  . Shortness of Breath  . Chest Pain   - admitted to ICU for resp failure, on chronic 4l home o2 - off bipap, now on high flow nasal cannula- baseline sats around 86% inspite of o2 - denies any complaints  REVIEW OF SYSTEMS:  Review of Systems  Constitutional: Positive for malaise/fatigue. Negative for fever and chills.  HENT: Negative for ear discharge, ear pain and nosebleeds.   Eyes: Negative for blurred vision and double vision.  Respiratory: Positive for cough and shortness of breath. Negative for wheezing.   Cardiovascular: Negative for chest pain, palpitations and leg swelling.  Gastrointestinal: Negative for nausea, vomiting, abdominal pain, diarrhea and constipation.  Genitourinary: Negative for dysuria and urgency.  Musculoskeletal: Negative for myalgias.  Neurological: Negative for dizziness, sensory change, speech change, focal weakness, seizures and headaches.  Psychiatric/Behavioral: Negative for depression.    DRUG ALLERGIES:   Allergies  Allergen Reactions  . No Known Allergies     VITALS:  Blood pressure 146/104, pulse 115, temperature 98.5 F (36.9 C), temperature source Oral, resp. rate 20, height _0  (1.778 m), weight 72.7 kg (160 lb 4.4 oz), SpO2 88 %.  PHYSICAL EXAMINATION:  Physical Exam  GENERAL:  75 y.o.-year-old patient lying in the bed with no acute distress.  EYES: Pupils equal, round, reactive to light and accommodation. No scleral icterus. Extraocular muscles intact.  HEENT: Head atraumatic, normocephalic. Oropharynx and nasopharynx clear.  NECK:  Supple, no jugular venous distention. No thyroid enlargement, no tenderness.  LUNGS: Tight breath sounds bilaterally. Scattered expiratory wheezing noted. Using accessory muscles to  breathe even with minimal exertion. -No rales, rhonchi or crackles heard. CARDIOVASCULAR: S1, S2 normal. No  rubs, or gallops. 2/6 systolic murmur is present ABDOMEN: Soft, nontender, nondistended. Bowel sounds present. No organomegaly or mass.  EXTREMITIES: No pedal edema, cyanosis, or clubbing.  NEUROLOGIC: Cranial nerves II through XII are intact. Muscle strength 5/5 in all extremities. Sensation intact. Gait not checked.  PSYCHIATRIC: The patient is alert and oriented x 3.  SKIN: No obvious rash, lesion, or ulcer.    LABORATORY PANEL:   CBC  Recent Labs Lab 01/25/16 0459  WBC 9.6  HGB 14.0  HCT 42.9  PLT 128*   ------------------------------------------------------------------------------------------------------------------  Chemistries   Recent Labs Lab 01/22/2016 1453  01/25/16 0459  NA 142  < > 142  K 4.8  < > 4.5  CL 111  < > 111  CO2 20*  < > 22  GLUCOSE 132*  < > 120*  BUN 28*  < > 31*  CREATININE 1.26*  < > 0.93  CALCIUM 9.1  < > 8.9  MG 2.2  --   --   AST 31  --   --   ALT 12*  --   --   ALKPHOS 72  --   --   BILITOT 2.4*  --   --   < > = values in this interval not displayed. ------------------------------------------------------------------------------------------------------------------  Cardiac Enzymes  Recent Labs Lab 01/22/2016 1453  TROPONINI 0.16*   ------------------------------------------------------------------------------------------------------------------  RADIOLOGY:  Ct Chest Wo Contrast  01/25/2016  CLINICAL DATA:  Acute onset of respiratory failure. Personal history of COPD. Initial encounter. EXAM: CT CHEST WITHOUT CONTRAST TECHNIQUE: Multidetector CT imaging of the chest was performed  following the standard protocol without IV contrast. COMPARISON:  Chest radiograph performed earlier today at 5:55 a.m. FINDINGS: Significant bilateral emphysematous change is noted, more prominent at the upper lung zones, with scattered scarring and  minimal airspace opacity noted at the lower lung zones. Mild superimposed infection cannot be excluded. There may also be focal airspace opacity at the left lung apex. No pleural effusion or pneumothorax is seen. Diffuse coronary artery calcifications are seen. The mediastinum is grossly unremarkable. No mediastinal lymphadenopathy is seen. No pericardial effusion is identified. The great vessels are grossly unremarkable. The thyroid gland is unremarkable. No axillary lymphadenopathy is seen. The visualized portions of the liver and spleen are unremarkable. An abdominal aortic stent is noted. Stones are noted within the gallbladder. The visualized portions of the pancreas and adrenal glands are within normal limits. No acute osseous abnormalities are identified. IMPRESSION: 1. Minimal airspace opacity in the lower lung zones, and question of focal airspace opacity at the left lung apex, which may reflect mild superimposed infection. 2. Underlying significant bilateral emphysematous change, more prominent at the upper lung zones, with scarring at the lower lung zones. 3. Diffuse coronary artery calcifications seen. 4. Cholelithiasis. Electronically Signed   By: Garald Balding M.D.   On: 01/25/2016 02:34    EKG:   Orders placed or performed during the hospital encounter of 01/22/2016  . EKG 12-Lead  . EKG 12-Lead  . EKG 12-Lead  . EKG 12-Lead    ASSESSMENT AND PLAN:   75 year old male with past medical history significant for chronic respiratory failure secondary to pulmonary hypertension and COPD on 4 L home oxygen, AAA, history of polycythemia, prostrate cancer and hypertension was brought into the hospital secondary to acute respiratory failure.  #1 acute on chronic respiratory failure-secondary to COPD exacerbation. -Also left upper lobe infiltrate-community-acquired pneumonia. -Patient was on BiPAP, under intensivist care. -Transitioned to high flow nasal cannula and is being transferred to the  floor. -Continue steroids, nebs and inhalers. -Baseline sats are around 82%. Goal is to keep sats at least about 82% -Palliative care consulted. -Continue Rocephin and azithromycin. Treat for pneumonia for 7 days total.  #2 anxiety-on Ativan when necessary  #3 GERD-change Pepcid to oral  #4 DVT prophylaxis-on Lovenox    All the records are reviewed and case discussed with Care Management/Social Workerr. Management plans discussed with the patient, family and they are in agreement.  CODE STATUS: DNR  TOTAL TIME TAKING CARE OF THIS PATIENT: 37 minutes.   POSSIBLE D/C IN 2-3 DAYS, DEPENDING ON CLINICAL CONDITION.   Gladstone Lighter M.D on 01/26/2016 at 3:03 PM  Between 7am to 6pm - Pager - [phone]  After 6pm go to [email or web address] - password [email or web address] [facility]  Office  [phone]  CC: Primary care physician; Golden Pop, MD

## 2016-01-26 NOTE — Progress Notes (Signed)
PULMONARY / CRITICAL CARE MEDICINE   Name: Charles Barton MRN: [id number] DOB: 1940-10-09     DISCUSSION: 75 yo male presents with shortness of breath and left sided infilterates and severe bullous emphysema. He has severe end-stage disease, and has been managing at home, his oxygen saturation frequently in the 70-80% range    ASSESSMENT / PLAN:  PULMONARY A: Acute on chronic respiratory failure due to end-stage COPD-review of CT scan performed overnight showed continued severe bullous emphysema with only slight degradation from previous CT scan in 2015, but still extremely severe lung disease. P:   Continue Albuterol/ pulmicort/ duoneb Wean HFNC  as tolerated-- to sat of 85% Continue abx.  Continue steroids   No further plans for intervention, we will await further input and planning from palliative care service. Patient can be transitioned to 4 care, will need subsequent transition to hospice setting as is. I don't foresee chance for much improvement from his current state.   HEMATOLOGIC A:   No active issues P:  SCDS lovenox for DVT prophylaxis  INFECTIOUS A:   Left sided PNA P:   Continue ceftriaxone/azithromycin   ----------------------------------------------  HISTORY OF PRESENT ILLNESS:   75 yo white male with acute and severe resp failure, patient on high flow nasal cannula oxygen   SUBJECTIVE:  Patient has no new complaints today, his breathing is a bit better today.  VITAL SIGNS: BP 121/110 mmHg  Pulse 106  Temp(Src) 97.6 F (36.4 C) (Oral)  Resp 15  Ht _0  (1.778 m)  Wt 160 lb 4.4 oz (72.7 kg)  BMI 23.00 kg/m2  SpO2 91%  HEMODYNAMICS:    VENTILATOR SETTINGS: Vent Mode:  [-]  FiO2 (%):  [60 %-70 %] 60 %  INTAKE / OUTPUT: I/O last 3 completed shifts: In: 1250 [P.O.:480; I.V.:320; IV Piggyback:450] Out: 400 [Urine:400]  PHYSICAL EXAMINATION: General:  Elderly, sickly appearing white male Neuro:  Awake, alert, oriented, follows  command. HEENT:  Atraumatic , normocephalic, no JVD appreciated Cardiovascular:  S1S2, RRR, no MRG noted Lungs:  Diminished bibasilar L> R, no wheezes, crackles, rhonchi noted. Abdomen: soft, nondistended, non tender Musculoskeletal:  No inflammation, deformity noted Skin:  Grossly intact  LABS:  BMET  Recent Labs Lab 01/03/2016 1453 01/24/16 0437 01/25/16 0459  NA 142 143 142  K 4.8 4.2 4.5  CL 111 113* 111  CO2 20* 20* 22  BUN 28* 28* 31*  CREATININE 1.26* 1.05 0.93  GLUCOSE 132* 182* 120*    Electrolytes  Recent Labs Lab 01/17/2016 1453 01/24/16 0437 01/25/16 0459  CALCIUM 9.1 8.7* 8.9  MG 2.2  --   --   PHOS 3.2  --   --     CBC  Recent Labs Lab 01/05/2016 1453 01/24/16 0437 01/25/16 0459  WBC 13.2* 8.4 9.6  HGB 16.5 13.9 14.0  HCT 51.1 43.0 42.9  PLT 139* 108* 128*    Coag's No results for input(s): APTT, INR in the last 168 hours.  Sepsis Markers  Recent Labs Lab 01/06/2016 1611 01/14/2016 1853  LATICACIDVEN 2.2* 3.3*    ABG  Recent Labs Lab 01/27/2016 1620 01/24/16 0526 01/25/16 0500  PHART 7.39 7.41 7.38  PCO2ART 29* 30* 37  PO2ART 68* 51* 54*    Liver Enzymes  Recent Labs Lab 01/13/2016 1453 01/26/16 0634  AST 31  --   ALT 12*  --   ALKPHOS 72  --   BILITOT 2.4*  --   ALBUMIN 4.0 3.2*    Cardiac Enzymes  Recent Labs Lab 01/24/2016 1453  TROPONINI 0.16*    Glucose No results for input(s): GLUCAP in the last 168 hours.  Imaging No results found.   STUDIES:  none  CULTURES:  4/23 MRSA pcr negative  ANTIBIOTICS: Vancomycin/merepenem 4/23 >>>  SIGNIFICANT EVENTS: none  LINES/TUBES: none     Deep Neka Bise M.D. 01/26/2016

## 2016-01-26 NOTE — Progress Notes (Signed)
Pt need to rest per RN

## 2016-01-27 LAB — ALBUMIN: ALBUMIN: 3.1 g/dL — AB (ref 3.5–5.0)

## 2016-01-27 MED ORDER — MORPHINE SULFATE (CONCENTRATE) 10 MG/0.5ML PO SOLN
5.0000 mg | Freq: Four times a day (QID) | ORAL | Status: DC
  Administered 2016-01-27 – 2016-01-31 (×16): 5 mg via ORAL
  Filled 2016-01-27 (×16): qty 1

## 2016-01-27 MED ORDER — BUDESONIDE 0.5 MG/2ML IN SUSP
0.5000 mg | Freq: Two times a day (BID) | RESPIRATORY_TRACT | Status: DC | PRN
  Administered 2016-01-30: 08:00:00 0.5 mg via RESPIRATORY_TRACT
  Filled 2016-01-27: qty 2

## 2016-01-27 NOTE — Progress Notes (Signed)
Attempted to wean O2 to 6LPM via nasal cannula. Pt. Immediately desated to SpO2 70% exhibiting profound dyspnea and shortness of breath.  O2 resumed via HFNC 35L@ 60%. Pt. Recovered to SpO2 82%.  Will continue to monitor and wean as tolerated.

## 2016-01-27 NOTE — Consult Note (Signed)
Pulmonary Critical Care  Initial Consult Note   [name] UDJ:[id number] DOB: 02/20/1941 DOA: 01/08/2016  Referring physician: Diannia Ruder, MD PCP: Golden Pop, MD   Chief Complaint: SOB  HPI: Charles Barton is a 75 y.o. male with history of severe endstage COPD presented to the ED with increased SOB. Patient was initially admitted to the ICU> He was found to have a left sided pneumonia and has been started on abx. Patient subsequently has been seen by palliative care and has been now made DNR. He has been weaned off the BIPAP. 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 Patient is currently comfortable.   Review of Systems:  Constitutional:   +fatigue.  HEENT:  No headaches, post nasal drip,  Cardio-vascular:  No chest pain, dizziness, palpitations  GI:  No heartburn, indigestion, abdominal pain Resp:  +shortness of breath No coughing up of blood Skin:  no rash or lesions.  Musculoskeletal:  No joint pain or swelling.   Remainder ROS performed and is unremarkable other than noted in HPI  Past Medical History  Diagnosis Date  . Abdominal aneurysm ([place])   . 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 Cancer [facility])     prostate  . Hypertension   . Hyperlipidemia   . CAD (coronary artery disease)   . COPD (chronic obstructive pulmonary disease) ([place])   . AAA (abdominal aortic aneurysm) ([place])   . 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 Aortic atherosclerosis [facility])    Past Surgical History  Procedure Laterality Date  . Abdominal aortic aneurysm repair  2014  . Femoral artery aneurysm repair  2014  . Femoral artery aneurysm repair    . Abdominal aortic aneurysm repair     Social History:  reports that he quit smoking about 9 years ago. He has never used smokeless tobacco. He reports that he does not drink alcohol or use illicit drugs.  Allergies  Allergen Reactions  . No Known  Allergies     Family History  Problem Relation Age of Onset  . Heart disease Mother   . Emphysema Father   . Diabetes Paternal Grandmother     Prior to Admission medications   Medication Sig Start Date End Date Taking? Authorizing Provider  albuterol (ACCUNEB) 0.63 MG/3ML nebulizer solution Take 1 ampule by nebulization every 6 (six) hours as needed for wheezing.   Yes Historical Provider, MD  albuterol (PROVENTIL HFA;VENTOLIN HFA) 108 (90 BASE) MCG/ACT inhaler Inhale 2 puffs into the lungs every 6 (six) hours as needed for wheezing or shortness of breath.   Yes Historical Provider, MD  budesonide-formoterol (SYMBICORT) 80-4.5 MCG/ACT inhaler Inhale 2 puffs into the lungs 2 (two) times daily.   Yes Historical Provider, MD  OPSUMIT 10 MG TABS Take 10 mg by mouth daily.  10/17/15  Yes Historical Provider, MD  OXYGEN Inhale 3-3.5 L/min into the lungs continuous.   Yes Historical Provider, MD  tiotropium (SPIRIVA) 18 MCG inhalation capsule Place 18 mcg into inhaler and inhale daily.   Yes Historical Provider, MD  fluticasone (FLONASE) 50 MCG/ACT nasal spray Place 1 spray into both nostrils daily. 03/25/15   Guadalupe Maple, MD   Physical Exam: Filed Vitals:   01/27/16 0827 01/27/16 1121 01/27/16 1435 01/27/16 1613  BP:   124/94   Pulse:   101   Temp:   97.6 F (36.4 C)   TempSrc:   Oral  Resp:   20   Height:      Weight:      SpO2: 80% 85% 82% 86%    Wt Readings from Last 3 Encounters:  01/27/16 75.705 kg (166 lb 14.4 oz)  12/17/15 68 kg (149 lb 14.6 oz)  11/04/15 66.225 kg (146 lb)    General:  Appears comfortable Eyes: PERRL, normal lids, irises & conjunctiva ENT: grossly normal lips tongue Neck: no LAD, masses or thyromegaly Cardiovascular: RRR, no m/r/g Respiratory: overall diminished with rhonchi Abdomen: soft, nontender Skin: no rash or induration seen on limited exam Musculoskeletal: grossly normal tone BUE/BLE Psychiatric: somnolent normal mood and  affect Neurologic: grossly non-focal.          Labs on Admission:  Basic Metabolic Panel:  Recent Labs Lab 01/25/2016 1453 01/24/16 0437 01/25/16 0459  NA 142 143 142  K 4.8 4.2 4.5  CL 111 113* 111  CO2 20* 20* 22  GLUCOSE 132* 182* 120*  BUN 28* 28* 31*  CREATININE 1.26* 1.05 0.93  CALCIUM 9.1 8.7* 8.9  MG 2.2  --   --   PHOS 3.2  --   --    Liver Function Tests:  Recent Labs Lab 01/07/2016 1453 01/26/16 0634 01/27/16 0446  AST 31  --   --   ALT 12*  --   --   ALKPHOS 72  --   --   BILITOT 2.4*  --   --   PROT 7.7  --   --   ALBUMIN 4.0 3.2* 3.1*   No results for input(s): LIPASE, AMYLASE in the last 168 hours. No results for input(s): AMMONIA in the last 168 hours. CBC:  Recent Labs Lab 01/07/2016 1453 01/24/16 0437 01/25/16 0459  WBC 13.2* 8.4 9.6  HGB 16.5 13.9 14.0  HCT 51.1 43.0 42.9  MCV 84.3 83.1 82.7  PLT 139* 108* 128*   Cardiac Enzymes:  Recent Labs Lab 01/22/2016 1453  TROPONINI 0.16*    BNP (last 3 results) No results for input(s): BNP in the last 8760 hours.  ProBNP (last 3 results) No results for input(s): PROBNP in the last 8760 hours.  CBG: No results for input(s): GLUCAP in the last 168 hours.  Radiological Exams on Admission: No results found.  EKG: Independently reviewed.  Assessment/Plan Active Problems:   Respiratory failure (HCC)   Protein-calorie malnutrition, severe   Hypoxia   1. Acute on Chronic Respiratory failure -will continue with oxygen therapy and decrease as tolerated -maintain SaO2 around 88% -prognosis poor  2. COPD severe Endstage -patient is on inhalers steroids  3. Left Lobar pneumonia -n abx will continue  Code Status: DNR  Family Communication: d/w family  Time spent: 66mn    I have personally obtained a history, examined the patient, evaluated laboratory and imaging results, formulated the assessment and plan and placed orders.  The Patient requires high complexity decision  making for assessment and support.    SAllyne Gee MD FTexas Health Presbyterian Hospital PlanoPulmonary Critical Care Medicine Sleep Medicine

## 2016-01-27 NOTE — Care Management Important Message (Signed)
Important Message  Patient Details  Name: Charles Barton MRN: [id number] Date of Birth: 01/17/1941   Medicare Important Message Given:  Yes    Juliann Pulse A Gordon Carlson 01/27/2016, 10:27 AM

## 2016-01-27 NOTE — Progress Notes (Signed)
[place] [facility] [place] NAME: Charles Barton    MR#:  [id number]  DATE OF BIRTH:  01/20/1941  SUBJECTIVE:  CHIEF COMPLAINT:   Chief Complaint  Patient presents with  . Shortness of Breath  . Chest Pain   - admitted to ICU for resp failure, on chronic 4l home o2 - off bipap, now on high flow nasal cannula- baseline sats around 86% inspite of o2 - Still remains on 52% FiO2. Frequent desaturations noted whenever he tries to talk. -Okay with increasing Roxanol dose today. Family at bedside.  REVIEW OF SYSTEMS:  Review of Systems  Constitutional: Positive for malaise/fatigue. Negative for fever and chills.  HENT: Negative for ear discharge, ear pain and nosebleeds.   Eyes: Negative for blurred vision and double vision.  Respiratory: Positive for cough and shortness of breath. Negative for wheezing.   Cardiovascular: Negative for chest pain, palpitations and leg swelling.  Gastrointestinal: Negative for nausea, vomiting, abdominal pain, diarrhea and constipation.  Genitourinary: Negative for dysuria and urgency.  Musculoskeletal: Negative for myalgias.  Neurological: Negative for dizziness, sensory change, speech change, focal weakness, seizures and headaches.  Psychiatric/Behavioral: Negative for depression.    DRUG ALLERGIES:   Allergies  Allergen Reactions  . No Known Allergies     VITALS:  Blood pressure 111/83, pulse 99, temperature 97.9 F (36.6 C), temperature source Oral, resp. rate 17, height _0  (1.778 m), weight 75.705 kg (166 lb 14.4 oz), SpO2 85 %.  PHYSICAL EXAMINATION:  Physical Exam  GENERAL:  75 y.o.-year-old patient sitting in the bed with no acute distress. Appears dyspneic even on talking. EYES: Pupils equal, round, reactive to light and accommodation. No scleral icterus. Extraocular muscles intact.  HEENT: Head atraumatic, normocephalic. Oropharynx and nasopharynx clear.  NECK:  Supple, no jugular venous  distention. No thyroid enlargement, no tenderness.  LUNGS: Tight breath sounds bilaterally. Scattered expiratory wheezing noted. Using accessory muscles to breathe even with minimal exertion. -No rales, rhonchi or crackles heard. CARDIOVASCULAR: S1, S2 normal. No  rubs, or gallops. 2/6 systolic murmur is present ABDOMEN: Soft, nontender, nondistended. Bowel sounds present. No organomegaly or mass.  EXTREMITIES: No pedal edema, cyanosis, or clubbing.  NEUROLOGIC: Cranial nerves II through XII are intact. Muscle strength 5/5 in all extremities. Sensation intact. Gait not checked.  PSYCHIATRIC: The patient is alert and oriented x 3.  SKIN: No obvious rash, lesion, or ulcer.    LABORATORY PANEL:   CBC  Recent Labs Lab 01/25/16 0459  WBC 9.6  HGB 14.0  HCT 42.9  PLT 128*   ------------------------------------------------------------------------------------------------------------------  Chemistries   Recent Labs Lab 01/14/2016 1453  01/25/16 0459  NA 142  < > 142  K 4.8  < > 4.5  CL 111  < > 111  CO2 20*  < > 22  GLUCOSE 132*  < > 120*  BUN 28*  < > 31*  CREATININE 1.26*  < > 0.93  CALCIUM 9.1  < > 8.9  MG 2.2  --   --   AST 31  --   --   ALT 12*  --   --   ALKPHOS 72  --   --   BILITOT 2.4*  --   --   < > = values in this interval not displayed. ------------------------------------------------------------------------------------------------------------------  Cardiac Enzymes  Recent Labs Lab 01/05/2016 1453  TROPONINI 0.16*   ------------------------------------------------------------------------------------------------------------------  RADIOLOGY:  No results found.  EKG:   Orders placed or performed during  the hospital encounter of 01/30/2016  . EKG 12-Lead  . EKG 12-Lead  . EKG 12-Lead  . EKG 12-Lead    ASSESSMENT AND PLAN:   75 year old male with past medical history significant for chronic respiratory failure secondary to pulmonary hypertension and  COPD on 4 L home oxygen, AAA, history of polycythemia, prostrate cancer and hypertension was brought into the hospital secondary to acute respiratory failure.  #1 acute on chronic respiratory failure-secondary to COPD exacerbation. -Also left upper lobe infiltrate-community-acquired pneumonia. -Patient has been off of BiPAP. Transferred from ICU to the floor. -Remains on high flow nasal cannula with flow rate of 35 L/m and FiO2 of 50% -Continue steroids, nebs and inhalers. -Baseline sats are around 82%. Goal is to keep sats at least about 81%. A device to rest and talking less due to desaturations. -Palliative care consulted. Dr. Megan Salon has put him on a slow FiO2 weaning schedule. Please honour that. -Continue Rocephin and azithromycin. Treat for pneumonia for 7 days total. - increase roxanol dose slightly- patient and family agreeable  #2 anxiety-on Ativan when necessary  #3 GERD-change Pepcid to oral  #4 DVT prophylaxis-on Lovenox  Ultimate goal is to discharge home with either home health or hospice services.    All the records are reviewed and case discussed with Care Management/Social Workerr. Management plans discussed with the patient, family and they are in agreement.  CODE STATUS: DNR  TOTAL TIME TAKING CARE OF THIS PATIENT: 37 minutes.   POSSIBLE D/C IN 2-3 DAYS, DEPENDING ON CLINICAL CONDITION.   Charles Barton M.D on 01/27/2016 at 11:46 AM  Between 7am to 6pm - Pager - [phone]  After 6pm go to [email or web address] - password [email or web address] [place] [facility]  Office  [phone]  CC: Primary care physician; Golden Pop, MD

## 2016-01-28 ENCOUNTER — Inpatient Hospital Stay: Payer: Commercial Managed Care - HMO

## 2016-01-28 LAB — CBC
HCT: 44.7 % (ref 40.0–52.0)
HEMOGLOBIN: 14.5 g/dL (ref 13.0–18.0)
MCH: 26.7 pg (ref 26.0–34.0)
MCHC: 32.5 g/dL (ref 32.0–36.0)
MCV: 82.2 fL (ref 80.0–100.0)
PLATELETS: 139 10*3/uL — AB (ref 150–440)
RBC: 5.44 MIL/uL (ref 4.40–5.90)
RDW: 20.6 % — ABNORMAL HIGH (ref 11.5–14.5)
WBC: 10.5 10*3/uL (ref 3.8–10.6)

## 2016-01-28 LAB — CULTURE, BLOOD (ROUTINE X 2)
CULTURE: NO GROWTH
Culture: NO GROWTH

## 2016-01-28 LAB — BASIC METABOLIC PANEL
ANION GAP: 8 (ref 5–15)
BUN: 26 mg/dL — ABNORMAL HIGH (ref 6–20)
CHLORIDE: 105 mmol/L (ref 101–111)
CO2: 24 mmol/L (ref 22–32)
Calcium: 8.8 mg/dL — ABNORMAL LOW (ref 8.9–10.3)
Creatinine, Ser: 0.83 mg/dL (ref 0.61–1.24)
Glucose, Bld: 134 mg/dL — ABNORMAL HIGH (ref 65–99)
POTASSIUM: 4.7 mmol/L (ref 3.5–5.1)
SODIUM: 137 mmol/L (ref 135–145)

## 2016-01-28 MED ORDER — MACITENTAN 10 MG PO TABS
10.0000 mg | ORAL_TABLET | Freq: Every day | ORAL | Status: DC
  Administered 2016-01-28 – 2016-02-02 (×4): 10 mg via ORAL
  Filled 2016-01-28 (×2): qty 1

## 2016-01-28 NOTE — Progress Notes (Signed)
Pulmonary Critical Care  Follow up Consult Note   Charles Barton GHW:[id number] DOB: [birth year] DOA: 01/17/2016  PCP: Golden Pop, MD    HPI: Charles Barton is a 75 y.o. male seen for follow up.Came by to have a family meeting however the family is not present at this time. I did call the two contact numbers available in the chart but there was no answer. Patient has been doing about the same. Right now still requiring high flow nasal cannula which is being gradually decreased. Patient is off the BIPAP at this time.   Review of Systems:   ROS performed and is unremarkable other than noted above  Past Medical History  Diagnosis Date  . Abdominal aneurysm ([place])   . 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 Cancer [facility])     prostate  . Hypertension   . Hyperlipidemia   . CAD (coronary artery disease)   . COPD (chronic obstructive pulmonary disease) ([place])   . AAA (abdominal aortic aneurysm) ([place])   . 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 Aortic atherosclerosis [facility])    Past Surgical History  Procedure Laterality Date  . Abdominal aortic aneurysm repair  2014  . Femoral artery aneurysm repair  2014  . Femoral artery aneurysm repair    . Abdominal aortic aneurysm repair     Social History:  reports that he quit smoking about 9 years ago. He has never used smokeless tobacco. He reports that he does not drink alcohol or use illicit drugs.  Allergies  Allergen Reactions  . No Known Allergies     Family History  Problem Relation Age of Onset  . Heart disease Mother   . Emphysema Father   . Diabetes Paternal Grandmother     Prior to Admission medications   Medication Sig Start Date End Date Taking? Authorizing Provider  albuterol (ACCUNEB) 0.63 MG/3ML nebulizer solution Take 1 ampule by nebulization every 6 (six) hours as needed for wheezing.   Yes Historical Provider, MD  albuterol (PROVENTIL HFA;VENTOLIN HFA) 108 (90 BASE) MCG/ACT inhaler Inhale 2 puffs into  the lungs every 6 (six) hours as needed for wheezing or shortness of breath.   Yes Historical Provider, MD  budesonide-formoterol (SYMBICORT) 80-4.5 MCG/ACT inhaler Inhale 2 puffs into the lungs 2 (two) times daily.   Yes Historical Provider, MD  OPSUMIT 10 MG TABS Take 10 mg by mouth daily.  10/17/15  Yes Historical Provider, MD  OXYGEN Inhale 3-3.5 L/min into the lungs continuous.   Yes Historical Provider, MD  tiotropium (SPIRIVA) 18 MCG inhalation capsule Place 18 mcg into inhaler and inhale daily.   Yes Historical Provider, MD  fluticasone (FLONASE) 50 MCG/ACT nasal spray Place 1 spray into both nostrils daily. 03/25/15   Guadalupe Maple, MD   Physical Exam: Filed Vitals:   01/28/16 0430 01/28/16 0500 01/28/16 0519 01/28/16 0822  BP:   125/82   Pulse:   107   Temp:   98.5 F (36.9 C)   TempSrc:   Oral   Resp:   24   Height:      Weight:  77.701 kg (171 lb 4.8 oz)    SpO2: 82%  80% 85%    Wt Readings from Last 3 Encounters:  01/28/16 77.701 kg (171 lb 4.8 oz)  12/17/15 68 kg (149 lb 14.6 oz)  11/04/15 66.225 kg (146 lb)    General:  Appears calm and comfortable Eyes: PERRL, normal lids, irises & conjunctiva ENT: grossly  normal hearing, lips & tongue Neck: no LAD, masses or thyromegaly Cardiovascular: RRR, no m/r/g. No LE edema. Respiratory: diminished BS bilaterally. Normal respiratory effort. Abdomen: soft, nontender Skin: no rash or induration seen on limited exam Musculoskeletal: grossly normal tone BUE/BLE Psychiatric: grossly normal mood and affect Neurologic: grossly non-focal.          Labs on Admission:  Basic Metabolic Panel:  Recent Labs Lab 01/11/2016 1453 01/24/16 0437 01/25/16 0459 01/28/16 0550  NA 142 143 142 137  K 4.8 4.2 4.5 4.7  CL 111 113* 111 105  CO2 20* 20* 22 24  GLUCOSE 132* 182* 120* 134*  BUN 28* 28* 31* 26*  CREATININE 1.26* 1.05 0.93 0.83  CALCIUM 9.1 8.7* 8.9 8.8*  MG 2.2  --   --   --   PHOS 3.2  --   --   --    Liver  Function Tests:  Recent Labs Lab 01/13/2016 1453 01/26/16 0634 01/27/16 0446  AST 31  --   --   ALT 12*  --   --   ALKPHOS 72  --   --   BILITOT 2.4*  --   --   PROT 7.7  --   --   ALBUMIN 4.0 3.2* 3.1*   No results for input(s): LIPASE, AMYLASE in the last 168 hours. No results for input(s): AMMONIA in the last 168 hours. CBC:  Recent Labs Lab 01/08/2016 1453 01/24/16 0437 01/25/16 0459 01/28/16 0550  WBC 13.2* 8.4 9.6 10.5  HGB 16.5 13.9 14.0 14.5  HCT 51.1 43.0 42.9 44.7  MCV 84.3 83.1 82.7 82.2  PLT 139* 108* 128* 139*   Cardiac Enzymes:  Recent Labs Lab 01/22/2016 1453  TROPONINI 0.16*    BNP (last 3 results) No results for input(s): BNP in the last 8760 hours.  ProBNP (last 3 results) No results for input(s): PROBNP in the last 8760 hours.  CBG: No results for input(s): GLUCAP in the last 168 hours.  Radiological Exams on Admission: Dg Chest Port 1 View  01/28/2016  CLINICAL DATA:  COPD EXAM: PORTABLE CHEST 1 VIEW COMPARISON:  01/24/2016 FINDINGS: Cardiomediastinal silhouette is stable. Again noted hyperinflation and emphysematous changes. Stable bilateral lower lobe fibrotic changes and scarring. Improvement in aeration in left upper lobe without definite superimposed infiltrate. Persistent residual scarring in left upper lobe. No pulmonary edema. IMPRESSION: Again noted hyperinflation and emphysematous changes. Stable bilateral lower lobe fibrotic changes and scarring. Improvement in aeration in left upper lobe without definite superimposed infiltrate. Persistent residual scarring in left upper lobe. No pulmonary edema. Electronically Signed   By: Lahoma Crocker M.D.   On: 01/28/2016 10:48    EKG: Independently reviewed.  Assessment/Plan Active Problems:   Respiratory failure (HCC)   Protein-calorie malnutrition, severe   Hypoxia   1. Acute on Chronic respiratory failure -will try to continue to slowly decrease his oxygen as tolerated -maintain  SaO2>88%  2. Pulmonary Arterial Hypertension with cor Pulmonale -patient has been on opsumet as an outpatient with confirmed severe PAH -need to see about resuming this medication if pressures tolerate  3. Left Lobar pneumonia -continue with oxygen therapy -continue with abx at this time     I have personally obtained a history, examined the patient, evaluated laboratory and imaging results, formulated the assessment and plan and placed orders.  The Patient requires high complexity decision making for assessment and support.    Allyne Gee, MD [facility] Pulmonary Critical Care Medicine Sleep Medicine

## 2016-01-28 NOTE — Progress Notes (Signed)
[place] at [place] NAME: Charles Barton    MR#:  [id number]  DATE OF BIRTH:  [birth year]  SUBJECTIVE:  CHIEF COMPLAINT:   Chief Complaint  Patient presents with  . Shortness of Breath  . Chest Pain   - admitted to ICU for resp failure, on chronic 4l home o2 - off bipap, now on high flow nasal cannula- baseline sats around 75-85% inspite of o2 - remains on 50% FiO2. Frequent desaturations noted whenever he tries to talk. -Complains of left shoulder pain and worried about his pneumonia.  REVIEW OF SYSTEMS:  Review of Systems  Constitutional: Positive for malaise/fatigue. Negative for fever and chills.  HENT: Negative for ear discharge, ear pain and nosebleeds.   Eyes: Negative for blurred vision and double vision.  Respiratory: Positive for cough and shortness of breath. Negative for wheezing.   Cardiovascular: Negative for chest pain, palpitations and leg swelling.  Gastrointestinal: Negative for nausea, vomiting, abdominal pain, diarrhea and constipation.  Genitourinary: Negative for dysuria and urgency.  Musculoskeletal: Negative for myalgias.       Left shoulder pain  Neurological: Negative for dizziness, sensory change, speech change, focal weakness, seizures and headaches.  Psychiatric/Behavioral: Negative for depression.    DRUG ALLERGIES:   Allergies  Allergen Reactions  . No Known Allergies     VITALS:  Blood pressure 125/82, pulse 107, temperature 98.5 F (36.9 C), temperature source Oral, resp. rate 24, height _0  (1.778 m), weight 77.701 kg (171 lb 4.8 oz), SpO2 85 %.  PHYSICAL EXAMINATION:  Physical Exam  GENERAL:  75 y.o.-year-old patient sitting in the bed with no acute distress. Appears dyspneic even on talking. EYES: Pupils equal, round, reactive to light and accommodation. No scleral icterus. Extraocular muscles intact.  HEENT: Head atraumatic, normocephalic. Oropharynx and nasopharynx clear.   NECK:  Supple, no jugular venous distention. No thyroid enlargement, no tenderness.  LUNGS: Moving air today, but Scattered expiratory wheezing noted. Using accessory muscles to breathe even with minimal exertion. -No rales, rhonchi or crackles heard. CARDIOVASCULAR: S1, S2 normal. No  rubs, or gallops. 2/6 systolic murmur is present ABDOMEN: Soft, nontender, nondistended. Bowel sounds present. No organomegaly or mass.  EXTREMITIES: No pedal edema, cyanosis, or clubbing.  NEUROLOGIC: Cranial nerves II through XII are intact. Muscle strength 5/5 in all extremities. Sensation intact. Gait not checked.  PSYCHIATRIC: The patient is alert and oriented x 3.  SKIN: No obvious rash, lesion, or ulcer.    LABORATORY PANEL:   CBC  Recent Labs Lab 01/28/16 0550  WBC 10.5  HGB 14.5  HCT 44.7  PLT 139*   ------------------------------------------------------------------------------------------------------------------  Chemistries   Recent Labs Lab 01/06/2016 1453  01/28/16 0550  NA 142  < > 137  K 4.8  < > 4.7  CL 111  < > 105  CO2 20*  < > 24  GLUCOSE 132*  < > 134*  BUN 28*  < > 26*  CREATININE 1.26*  < > 0.83  CALCIUM 9.1  < > 8.8*  MG 2.2  --   --   AST 31  --   --   ALT 12*  --   --   ALKPHOS 72  --   --   BILITOT 2.4*  --   --   < > = values in this interval not displayed. ------------------------------------------------------------------------------------------------------------------  Cardiac Enzymes  Recent Labs Lab 01/20/2016 1453  TROPONINI 0.16*   ------------------------------------------------------------------------------------------------------------------  RADIOLOGY:  No results found.  EKG:   Orders placed or performed during the hospital encounter of 01/12/2016  . EKG 12-Lead  . EKG 12-Lead  . EKG 12-Lead  . EKG 12-Lead    ASSESSMENT AND PLAN:   75 year old male with past medical history significant for chronic respiratory failure secondary to  pulmonary hypertension and COPD on 4 L home oxygen, AAA, history of polycythemia, prostrate cancer and hypertension was brought into the hospital secondary to acute respiratory failure.  #1 acute on chronic respiratory failure-secondary to COPD exacerbation. -Also left upper lobe infiltrate-community-acquired pneumonia. -Patient has been off of BiPAP. Transferred from ICU to the floor. -Remains on high flow nasal cannula with flow rate of 30 L/m and FiO2 of 50% -Continue steroids, nebs and inhalers. -Baseline sats are around 82%. Goal is to keep sats at least about 81%. Advised to rest and talking less due to desaturations. -Palliative care consulted. Dr. Megan Salon has put him on a slow FiO2 weaning schedule. Please honour that. -Continue Rocephin and azithromycin. Treat for pneumonia for 7 days total. - increase roxanol dose slightly- patient and family agreeable - f/u CXR today  #2 anxiety-on Ativan when necessary  #3 GERD-change Pepcid to oral  #4 DVT prophylaxis-on Lovenox  Ultimate goal is to discharge home with either home health or hospice services.    All the records are reviewed and case discussed with Care Management/Social Workerr. Management plans discussed with the patient, family and they are in agreement.  CODE STATUS: DNR  TOTAL TIME TAKING CARE OF THIS PATIENT: 37 minutes.   POSSIBLE D/C IN 2-3 DAYS, DEPENDING ON CLINICAL CONDITION.   Gladstone Lighter M.D on 01/28/2016 at 9:38 AM  Between 7am to 6pm - Pager - [phone]  After 6pm go to [email or web address] - password [email or web address] [place] [facility]  Office  [phone]  CC: Primary care physician; Golden Pop, MD

## 2016-01-28 NOTE — Progress Notes (Signed)
Nutrition Follow-up  DOCUMENTATION CODES:   Severe malnutrition in context of chronic illness  INTERVENTION:   -Cater to pt preferences on Dysphagia III diet order -Continue Ensure as ordered   NUTRITION DIAGNOSIS:   Malnutrition related to chronic illness as evidenced by severe depletion of body fat, severe depletion of muscle mass.  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 Follow-up chest x-ray performed this am on rounds.  Diet Order:  DIET DYS 3 Room service appropriate?: Yes; Fluid consistency:: Thin   Pt ate 100% of observed breakfast meal this morning. Per documentation pt ate 100% of meals yesterday, 88% of meals on average the past few days.   Gastrointestinal Profile: Last BM: 01/28/2016  Medications: Soldumedrol Labs: reviewed    Weight Trend since Admission: Filed Weights   01/26/16 0453 01/27/16 0500 01/28/16 0500  Weight: 160 lb 4.4 oz (72.7 kg) 166 lb 14.4 oz (75.705 kg) 171 lb 4.8 oz (77.701 kg)    Skin:  Reviewed, no issues   BMI:  Body mass index is 24.58 kg/(m^2).  Estimated Nutritional Needs:   Kcal:  2250-2625 kcals (30-35 kcals/kg)   Protein:  90-113 g (1.2-1.5 g/kg)   Fluid:  >2 L per day  EDUCATION NEEDS:   No education needs identified at this time  Dwyane Luo, RD, LDN Pager [phone] Weekend/On-Call Pager [phone]

## 2016-01-28 NOTE — Progress Notes (Signed)
Pharmacy Antibiotic Note  Charles Barton is a 75 y.o. male admitted on 01/14/2016 with pneumonia.  Pharmacy has been consulted for ceftriaxone dosing.  This is day #5 of ceftriaxone.   Plan: Continue ceftriaxone 1 g IV daily thru 4/30 per MD   Height: _0  (177.8 cm) Weight: 171 lb 4.8 oz (77.701 kg) IBW/kg (Calculated) : 73  Temp (24hrs), Avg:98.5 F (36.9 C), Min:98.5 F (36.9 C), Max:98.5 F (36.9 C)   Recent Labs Lab 01/30/2016 1453 01/15/2016 1611 01/28/2016 1853 01/24/16 0437 01/25/16 0459 01/28/16 0550  WBC 13.2*  --   --  8.4 9.6 10.5  CREATININE 1.26*  --   --  1.05 0.93 0.83  LATICACIDVEN  --  2.2* 3.3*  --   --   --     Estimated Creatinine Clearance: 81.8 mL/min (by C-G formula based on Cr of 0.83).    Allergies  Allergen Reactions  . No Known Allergies    Antimicrobials this admission: meropenem 4/23 >> 4/24 vancomycin 4/23 >> 4/24 4/23 cefepime >> 4/23 Azithromycin 4/23 >>4/27 Ceftriaxone 4/24 >>  Dose adjustments this admission: 4/24 antibiotics changed from vancomycin/meropenem/azithromycin to azithromycin + ceftriaxone  Microbiology results: 4/23 BCx: NGTD 4/23 MRSA PCR: negative  Thank you for allowing pharmacy to be a part of this patient's care.  Noralee Space, PharmD Clinical Pharmacist 01/28/2016 3:34 PM

## 2016-01-29 NOTE — Plan of Care (Signed)
Problem: Activity: Goal: Risk for activity intolerance will decrease Outcome: Not Progressing Trying to wean pt's oxygen based upon MD's orders; pt's oxygen saturation decreases when he is oob to bsc and while eating

## 2016-01-29 NOTE — Progress Notes (Signed)
Patient continues to run in 70's with o2 saturation on HFNC.  Arrived to find patient at 67% on 53%35l.  Titrated him up to 60% 45L to maintain at saturation of 80%. Patient states he is known to have o2 levels in 70's when at home on nasal cannula. Patient is not symptomatic despite readings.

## 2016-01-29 NOTE — Progress Notes (Signed)
[facility] at [place] NAME: Charles Barton    MR#:  [id number]  DATE OF BIRTH:  09-11-1941  SUBJECTIVE:  CHIEF COMPLAINT:   Chief Complaint  Patient presents with  . Shortness of Breath  . Chest Pain   - admitted to ICU for resp failure, on chronic 4l home o2 - off bipap, now on high flow nasal cannula- baseline sats around 75-85% inspite of o2 - remains on 50% FiO2. Frequent desaturations noted whenever he tries to talk. -Complains of left shoulder pain and worried about his pneumonia. - He told at home his O2 sats run in 71-75 and he is comfotable with that. - We set target SPO2 at 75 % and try to taper oxygen supply.  REVIEW OF SYSTEMS:  Review of Systems  Constitutional: Positive for malaise/fatigue. Negative for fever and chills.  HENT: Negative for ear discharge, ear pain and nosebleeds.   Eyes: Negative for blurred vision and double vision.  Respiratory: Positive for cough and shortness of breath. Negative for wheezing.   Cardiovascular: Negative for chest pain, palpitations and leg swelling.  Gastrointestinal: Negative for nausea, vomiting, abdominal pain, diarrhea and constipation.  Genitourinary: Negative for dysuria and urgency.  Musculoskeletal: Negative for myalgias.       Left shoulder pain  Neurological: Negative for dizziness, sensory change, speech change, focal weakness, seizures and headaches.  Psychiatric/Behavioral: Negative for depression.    DRUG ALLERGIES:   Allergies  Allergen Reactions  . No Known Allergies     VITALS:  Blood pressure 122/89, pulse 104, temperature 98 F (36.7 C), temperature source Oral, resp. rate 16, height _0  (1.778 m), weight 74.39 kg (164 lb), SpO2 85 %.  PHYSICAL EXAMINATION:  Physical Exam  GENERAL:  75 y.o.-year-old patient sitting in the bed with no acute distress. Appears dyspneic even on talking. EYES: Pupils equal, round, reactive to light and accommodation.  No scleral icterus. Extraocular muscles intact.  HEENT: Head atraumatic, normocephalic. Oropharynx and nasopharynx clear.  NECK:  Supple, no jugular venous distention. No thyroid enlargement, no tenderness.  LUNGS: Moving air today, but Scattered expiratory wheezing noted. Using accessory muscles to breathe even with minimal exertion. On HFNC. -No rales, rhonchi or crackles heard. CARDIOVASCULAR: S1, S2 normal. No  rubs, or gallops. 2/6 systolic murmur is present ABDOMEN: Soft, nontender, nondistended. Bowel sounds present. No organomegaly or mass.  EXTREMITIES: No pedal edema, cyanosis, or clubbing.  NEUROLOGIC: Cranial nerves II through XII are intact. Muscle strength 5/5 in all extremities. Sensation intact. Gait not checked.  PSYCHIATRIC: The patient is alert and oriented x 3.  SKIN: No obvious rash, lesion, or ulcer.    LABORATORY PANEL:   CBC  Recent Labs Lab 01/28/16 0550  WBC 10.5  HGB 14.5  HCT 44.7  PLT 139*   ------------------------------------------------------------------------------------------------------------------  Chemistries   Recent Labs Lab 01/03/2016 1453  01/28/16 0550  NA 142  < > 137  K 4.8  < > 4.7  CL 111  < > 105  CO2 20*  < > 24  GLUCOSE 132*  < > 134*  BUN 28*  < > 26*  CREATININE 1.26*  < > 0.83  CALCIUM 9.1  < > 8.8*  MG 2.2  --   --   AST 31  --   --   ALT 12*  --   --   ALKPHOS 72  --   --   BILITOT 2.4*  --   --   < > =  values in this interval not displayed. ------------------------------------------------------------------------------------------------------------------  Cardiac Enzymes  Recent Labs Lab 01/19/2016 1453  TROPONINI 0.16*   ------------------------------------------------------------------------------------------------------------------  RADIOLOGY:  Dg Chest Port 1 View  01/28/2016  CLINICAL DATA:  COPD EXAM: PORTABLE CHEST 1 VIEW COMPARISON:  01/24/2016 FINDINGS: Cardiomediastinal silhouette is stable. Again  noted hyperinflation and emphysematous changes. Stable bilateral lower lobe fibrotic changes and scarring. Improvement in aeration in left upper lobe without definite superimposed infiltrate. Persistent residual scarring in left upper lobe. No pulmonary edema. IMPRESSION: Again noted hyperinflation and emphysematous changes. Stable bilateral lower lobe fibrotic changes and scarring. Improvement in aeration in left upper lobe without definite superimposed infiltrate. Persistent residual scarring in left upper lobe. No pulmonary edema. Electronically Signed   By: Lahoma Crocker M.D.   On: 01/28/2016 10:48    EKG:   Orders placed or performed during the hospital encounter of 01/19/2016  . EKG 12-Lead  . EKG 12-Lead  . EKG 12-Lead  . EKG 12-Lead    ASSESSMENT AND PLAN:   75 year old male with past medical history significant for chronic respiratory failure secondary to pulmonary hypertension and COPD on 4 L home oxygen, AAA, history of polycythemia, prostrate cancer and hypertension was brought into the hospital secondary to acute respiratory failure.  #1 acute on chronic respiratory failure-secondary to COPD exacerbation. Gradually improving. -Also left upper lobe infiltrate-community-acquired pneumonia. -Patient has been off of BiPAP. Transferred from ICU to the floor. -Remains on high flow nasal cannula  -Continue steroids, nebs and inhalers. -Baseline sats are around 82%. Goal is to keep sats at least about 75%. Advised to rest and talking less due to desaturations. -Palliative care consulted. Dr. Megan Salon has put him on a slow FiO2 weaning schedule. Please honour that. -Continue Rocephin and azithromycin. Treat for pneumonia for 7 days total. - increase roxanol dose slightly- patient and family agreeable - f/u CXR shows improvement.  #2 anxiety-on Ativan when necessary  #3 GERD-change Pepcid to oral  #4 DVT prophylaxis-on Lovenox  Ultimate goal is to discharge home with either home health  or hospice services once able to taper to nasal canula oxygen.    All the records are reviewed and case discussed with Care Management/Social Workerr. Management plans discussed with the patient, family and they are in agreement.  CODE STATUS: DNR  TOTAL TIME TAKING CARE OF THIS PATIENT: 37 minutes.   POSSIBLE D/C IN 2-3 DAYS, DEPENDING ON CLINICAL CONDITION.   Vaughan Basta M.D on 01/29/2016 at 11:20 AM  Between 7am to 6pm - Pager - [phone]  After 6pm go to [email or web address] - password [email or web address] [facility]  Office  [phone]  CC: Primary care physician; Golden Pop, MD

## 2016-01-30 LAB — CREATININE, SERUM
CREATININE: 0.95 mg/dL (ref 0.61–1.24)
GFR calc Af Amer: >60 mL/min (ref >60)

## 2016-01-30 NOTE — Plan of Care (Signed)
Problem: Activity: Goal: Risk for activity intolerance will decrease Outcome: Not Progressing Pt remains on HFNC 35%, unable to wean this shift; dyspnea on exertion to get oob to bsc; and for hygiene at bedside

## 2016-01-30 NOTE — Care Management Note (Signed)
Case Management Note  Patient Details  Name: Sandford Diop MRN: [id number] Date of Birth: 24-Oct-1940  Subjective/Objective:    Discussed HFNC weaning with Bailey Mech RN. Mr Terlizzi has been weaned from 40% down to 35% by [place]. Weaning is progressing slowly but surely.  Mr Mcmanaway wants to discharge to home. He was on chronic 4L [place] from [place] prior to this admission. The family contacted [facility] on their own about in-home hospice services. Case management will continue to follow for discharge planning.                Action/Plan:   Expected Discharge Date:                  Expected Discharge Plan:  Home/Self Care  In-House Referral:     Discharge planning Services  CM Consult  Post Acute Care Choice:    Choice offered to:     DME Arranged:    DME Agency:     HH Arranged:  Patient Refused HH Agency:     Status of Service:  In process, will continue to follow  Medicare Important Message Given:  Yes Date Medicare IM Given:    Medicare IM give by:    Date Additional Medicare IM Given:    Additional Medicare Important Message give by:     If discussed at [place] of Stay Meetings, dates discussed:    Additional Comments:  Javeria Briski A, RN 01/30/2016, 1:26 PM

## 2016-01-30 NOTE — Progress Notes (Signed)
[name] at [place] NAME: Charles Barton    MR#:  [id number]  DATE OF BIRTH:  [birth year]  SUBJECTIVE:  CHIEF COMPLAINT:   Chief Complaint  Patient presents with  . Shortness of Breath  . Chest Pain   - admitted to ICU for resp failure, on chronic 4l home o2 - off bipap, now on high flow nasal cannula- baseline sats around 75-85% inspite of o2 - remains on 50% FiO2. Frequent desaturations noted whenever he tries to talk. -Complains of left shoulder pain and worried about his pneumonia. - He told at home his O2 sats run in 71-75 and he is comfotable with that. - We set target SPO2 at 75 % and try to taper oxygen supply.    his oxygen level drops significantly with minimal exertion.  REVIEW OF SYSTEMS:  Review of Systems  Constitutional: Positive for malaise/fatigue. Negative for fever and chills.  HENT: Negative for ear discharge, ear pain and nosebleeds.   Eyes: Negative for blurred vision and double vision.  Respiratory: Positive for cough and shortness of breath. Negative for wheezing.   Cardiovascular: Negative for chest pain, palpitations and leg swelling.  Gastrointestinal: Negative for nausea, vomiting, abdominal pain, diarrhea and constipation.  Genitourinary: Negative for dysuria and urgency.  Musculoskeletal: Negative for myalgias.       Left shoulder pain  Neurological: Negative for dizziness, sensory change, speech change, focal weakness, seizures and headaches.  Psychiatric/Behavioral: Negative for depression.    DRUG ALLERGIES:   Allergies  Allergen Reactions  . No Known Allergies     VITALS:  Blood pressure 131/91, pulse 106, temperature 97.5 F (36.4 C), temperature source Oral, resp. rate 16, height _0  (1.778 m), weight 74.072 kg (163 lb 4.8 oz), SpO2 77 %.  PHYSICAL EXAMINATION:  Physical Exam  GENERAL:  75 y.o.75-year-old patient sitting in the bed with no acute distress. Appears dyspneic even on  talking. EYES: Pupils equal, round, reactive to light and accommodation. No scleral icterus. Extraocular muscles intact.  HEENT: Head atraumatic, normocephalic. Oropharynx and nasopharynx clear.  NECK:  Supple, no jugular venous distention. No thyroid enlargement, no tenderness.  LUNGS: Moving air today, but Scattered expiratory wheezing noted. Using accessory muscles to breathe even with minimal exertion. On HFNC. -No rales, rhonchi or crackles heard. CARDIOVASCULAR: S1, S2 normal. No  rubs, or gallops. 2/6 systolic murmur is present ABDOMEN: Soft, nontender, nondistended. Bowel sounds present. No organomegaly or mass.  EXTREMITIES: Slight pedal edema, no cyanosis, or clubbing. Distal pulses are present on both feet. NEUROLOGIC: Cranial nerves II through XII are intact. Muscle strength 5/5 in all extremities. Sensation intact. Gait not checked.  PSYCHIATRIC: The patient is alert and oriented x 3.  SKIN: No obvious rash, lesion, or ulcer.    LABORATORY PANEL:   CBC  Recent Labs Lab 01/28/16 0550  WBC 10.5  HGB 14.5  HCT 44.7  PLT 139*   ------------------------------------------------------------------------------------------------------------------  Chemistries   Recent Labs Lab 01/25/2016 1453  01/28/16 0550 01/30/16 0442  NA 142  < > 137  --   K 4.8  < > 4.7  --   CL 111  < > 105  --   CO2 20*  < > 24  --   GLUCOSE 132*  < > 134*  --   BUN 28*  < > 26*  --   CREATININE 1.26*  < > 0.83 0.95  CALCIUM 9.1  < > 8.8*  --   MG  2.2  --   --   --   AST 31  --   --   --   ALT 12*  --   --   --   ALKPHOS 72  --   --   --   BILITOT 2.4*  --   --   --   < > = values in this interval not displayed. ------------------------------------------------------------------------------------------------------------------  Cardiac Enzymes  Recent Labs Lab 01/03/2016 1453  TROPONINI 0.16*    ------------------------------------------------------------------------------------------------------------------  RADIOLOGY:  No results found.  EKG:   Orders placed or performed during the hospital encounter of 01/25/2016  . EKG 12-Lead  . EKG 12-Lead  . EKG 12-Lead  . EKG 12-Lead    ASSESSMENT AND PLAN:   75 year old male with past medical history significant for chronic respiratory failure secondary to pulmonary hypertension and COPD on 4 L home oxygen, AAA, history of polycythemia, prostrate cancer and hypertension was brought into the hospital secondary to acute respiratory failure.  #1 acute on chronic respiratory failure-secondary to COPD exacerbation. Gradually improving. -Also left upper lobe infiltrate-community-acquired pneumonia. -Patient has been off of BiPAP. Transferred from ICU to the floor. -Remains on high flow nasal cannula  -Continue steroids, nebs and inhalers. -Baseline sats are around 82%. Goal is to keep sats at least about 75%. Advised to rest and talking less due to desaturations. -Palliative care consulted. Dr. Megan Salon has put him on a slow FiO2 weaning schedule. Please honour that. -Continue Rocephin and azithromycin. Treat for pneumonia for 7 days total. - increase roxanol dose slightly- patient and family agreeable - f/u CXR shows improvement. - He may need a higher concentration of oxygen at home, will try to arrange for that. Meanwhile in the hospital will continue trying to taper his oxygen requirement if he can come off from high flow nasal cannula oxygen then we can arrange for this discharge. - I encouraged him to use incentive spirometry.  #2 anxiety-on Ativan when necessary  #3 GERD-change Pepcid to oral  #4 DVT prophylaxis-on Lovenox  Ultimate goal is to discharge home with either home health or hospice services once able to taper to nasal canula oxygen.    All the records are reviewed and case discussed with Care Management/Social  Workerr. Management plans discussed with the patient, family and they are in agreement.  CODE STATUS: DNR  TOTAL TIME TAKING CARE OF THIS PATIENT: 35 minutes.   I discussed the findings and plan with patient's son and daughter who are present in the room during my visit, patient approved discussing all the medical details with them.  POSSIBLE D/C IN 2-3 DAYS, DEPENDING ON CLINICAL CONDITION.   Vaughan Basta M.D on 01/30/2016 at 12:50 PM  Between 7am to 6pm - Pager - [phone]  After 6pm go to [email or web address] - password [email or web address] [facility]  Office  [phone]  CC: Primary care physician; Golden Pop, MD

## 2016-01-31 MED ORDER — ARFORMOTEROL TARTRATE 15 MCG/2ML IN NEBU
15.0000 ug | INHALATION_SOLUTION | Freq: Two times a day (BID) | RESPIRATORY_TRACT | Status: DC
  Administered 2016-01-31 – 2016-02-02 (×4): 15 ug via RESPIRATORY_TRACT
  Filled 2016-01-31 (×6): qty 2

## 2016-01-31 MED ORDER — SODIUM CHLORIDE 0.9 % IV SOLN: 1.0000 mg/h | INTRAVENOUS | Status: DC

## 2016-01-31 MED ORDER — MORPHINE 100MG IN NS 100ML (1MG/ML) PREMIX INFUSION
3.0000 mg/h | INTRAVENOUS | Status: DC
Start: 2016-01-31 — End: 2016-02-02
  Administered 2016-01-31: 1 mg/h via INTRAVENOUS
  Administered 2016-02-02: 13:00:00 1.5 mg/h via INTRAVENOUS
  Filled 2016-01-31 (×3): qty 100

## 2016-01-31 MED ORDER — IPRATROPIUM-ALBUTEROL 0.5-2.5 (3) MG/3ML IN SOLN
3.0000 mL | RESPIRATORY_TRACT | Status: DC
  Administered 2016-01-31 – 2016-02-02 (×12): 3 mL via RESPIRATORY_TRACT
  Filled 2016-01-31 (×12): qty 3

## 2016-01-31 MED ORDER — BUDESONIDE 0.25 MG/2ML IN SUSP
0.2500 mg | Freq: Two times a day (BID) | RESPIRATORY_TRACT | Status: DC
  Administered 2016-01-31 – 2016-02-02 (×4): 0.25 mg via RESPIRATORY_TRACT
  Filled 2016-01-31 (×4): qty 2

## 2016-01-31 NOTE — Progress Notes (Signed)
[place] at [place] NAME: Charles Barton    MR#:  [id number]  DATE OF BIRTH:  Jul 22, 1941  SUBJECTIVE:  CHIEF COMPLAINT:   Chief Complaint  Patient presents with  . Shortness of Breath  . Chest Pain   - admitted to ICU for resp failure, on chronic 4l home o2 - off bipap, now on high flow nasal cannula- baseline sats around 75-85% inspite of o2 - remains on 50% FiO2. Frequent desaturations noted whenever he tries to talk. -Complains of left shoulder pain and worried about his pneumonia. - He told at home his O2 sats run in 71-75 and he is comfotable with that. - We set target SPO2 at 75 % and try to taper oxygen supply.    his oxygen level drops significantly with minimal exertion.  still Continue to require 35 ltr oxygen supply.  REVIEW OF SYSTEMS:  Review of Systems  Constitutional: Positive for malaise/fatigue. Negative for fever and chills.  HENT: Negative for ear discharge, ear pain and nosebleeds.   Eyes: Negative for blurred vision and double vision.  Respiratory: Positive for cough and shortness of breath. Negative for wheezing.   Cardiovascular: Negative for chest pain, palpitations and leg swelling.  Gastrointestinal: Negative for nausea, vomiting, abdominal pain, diarrhea and constipation.  Genitourinary: Negative for dysuria and urgency.  Musculoskeletal: Negative for myalgias.       Left shoulder pain  Neurological: Negative for dizziness, sensory change, speech change, focal weakness, seizures and headaches.  Psychiatric/Behavioral: Negative for depression.    DRUG ALLERGIES:   Allergies  Allergen Reactions  . No Known Allergies     VITALS:  Blood pressure 101/66, pulse 112, temperature 97.5 F (36.4 C), temperature source Oral, resp. rate 24, height _0  (1.778 m), weight 74.39 kg (164 lb), SpO2 62 %.  PHYSICAL EXAMINATION:  Physical Exam  GENERAL:  75 y.o.-year-old patient sitting in the bed with no  acute distress. Appears dyspneic even on talking. EYES: Pupils equal, round, reactive to light and accommodation. No scleral icterus. Extraocular muscles intact.  HEENT: Head atraumatic, normocephalic. Oropharynx and nasopharynx clear.  NECK:  Supple, no jugular venous distention. No thyroid enlargement, no tenderness.  LUNGS: Moving air today, but Scattered expiratory wheezing noted. Using accessory muscles to breathe even with minimal exertion. On HFNC. -No rales, rhonchi or crackles heard. CARDIOVASCULAR: S1, S2 normal. No  rubs, or gallops. 2/6 systolic murmur is present ABDOMEN: Soft, nontender, nondistended. Bowel sounds present. No organomegaly or mass.  EXTREMITIES: Slight pedal edema, no cyanosis, or clubbing. Distal pulses are present on both feet. NEUROLOGIC: Cranial nerves II through XII are intact. Muscle strength 5/5 in all extremities. Sensation intact. Gait not checked.  PSYCHIATRIC: The patient is alert and oriented x 3.  SKIN: No obvious rash, lesion, or ulcer.    LABORATORY PANEL:   CBC  Recent Labs Lab 01/28/16 0550  WBC 10.5  HGB 14.5  HCT 44.7  PLT 139*   ------------------------------------------------------------------------------------------------------------------  Chemistries   Recent Labs Lab 01/28/16 0550 01/30/16 0442  NA 137  --   K 4.7  --   CL 105  --   CO2 24  --   GLUCOSE 134*  --   BUN 26*  --   CREATININE 0.83 0.95  CALCIUM 8.8*  --    ------------------------------------------------------------------------------------------------------------------  Cardiac Enzymes No results for input(s): TROPONINI in the last 168 hours. ------------------------------------------------------------------------------------------------------------------  RADIOLOGY:  No results found.  EKG:   Orders placed or  performed during the hospital encounter of 01/08/2016  . EKG 12-Lead  . EKG 12-Lead  . EKG 12-Lead  . EKG 12-Lead    ASSESSMENT AND  PLAN:   75 year old male with past medical history significant for chronic respiratory failure secondary to pulmonary hypertension and COPD on 4 L home oxygen, AAA, history of polycythemia, prostrate cancer and hypertension was brought into the hospital secondary to acute respiratory failure.  #1 acute on chronic respiratory failure-secondary to COPD exacerbation. Gradually improving. -Also left upper lobe infiltrate-community-acquired pneumonia. -Patient has been off of BiPAP. Transferred from ICU to the floor. -Remains on high flow nasal cannula  -Continue steroids, nebs and inhalers. -Baseline sats are around 82%. Goal is to keep sats at least about 75%. Advised to rest and talking less due to desaturations. -Palliative care consulted. Dr. Megan Salon has put him on a slow FiO2 weaning schedule. Please honour that. -Continue Rocephin and azithromycin. Treated for pneumonia for 7 days total. - increase roxanol dose slightly- patient and family agreeable - f/u CXR shows improvement. - He may need a higher concentration of oxygen at home, will try to arrange for that. Meanwhile in the hospital will continue trying to taper his oxygen requirement if he can come off from high flow nasal cannula oxygen then we can arrange for this discharge. He and his family had agreed on getting hospice services at home on discharge. - I encouraged him to use incentive spirometry.  #2 anxiety-on Ativan when necessary  #3 GERD-change Pepcid to oral  #4 DVT prophylaxis-on Lovenox  Ultimate goal is to discharge home with either home health or hospice services once able to taper to nasal canula oxygen.  Unfortunately we are not able to taper down oxygen supply, he and his kids are having meeting with palliative care today for discussing further plan.    All the records are reviewed and case discussed with Care Management/Social Workerr. Management plans discussed with the patient, family and they are in  agreement.  CODE STATUS: DNR  TOTAL TIME TAKING CARE OF THIS PATIENT: 35 minutes.     POSSIBLE D/C IN 2-3 DAYS, DEPENDING ON CLINICAL CONDITION.   Charles Barton M.D on 01/31/2016 at 1:53 PM  Between 7am to 6pm - Pager - [phone]  After 6pm go to [email or web address] - password [email or web address] [facility]  Office  [phone]  CC: Primary care physician; Golden Pop, MD

## 2016-01-31 NOTE — Progress Notes (Signed)
Palliative Medicine Inpatient Consult Follow Up Note   Name: Charles Barton Date: 01/31/2016 MRN: [id number]  DOB: [birth year]  Referring Physician: Vaughan Basta, MD  Palliative Care consult requested for this 75 y.o. male for goals of medical therapy in patient with end stage bullous emphysema.   DISCUSSIONS AND PLAN:   I met with pt, one daughter, and one son today for over an hour to discuss the problem of not being able to get him weaned down lower on the HI Flow Oxygen.  He was tried on lower amounts of Hi Flow at times during the last four days, but he has had to be increased and now is staying on 35 LPM and about 40-45%.  Goal in order for him to have Hi Flow at home is 15 LPM and 35%.  He has not been able to go that low at all. I had hoped that perhaps there was still some pneumonia to treat and that if we just gave it more time, this would work.  Or that he had some fluid we could diurese.  But, Friday's CXR was clear and showed only the end stage bullous emphysema. Therefore, there is little if anything to 'fix' or reverse.  His lungs have quite exchanging enough oxygen for him to survive.    We spent most of the time talking about the reality of the situation.  Firstly, he is not able to be transported on Hi FLow and the ride home is 15 minutes. He would need to be stable on an alternate oxygen delivery system --and this stability is not very likely to happen.  Hence, EMS is unlikely to transport an actively dying man to his home, since they don't take people that are this unstable wiithout assuring their stability first.  Pt said he would rather go home in his son's car. Son was here and he said he would take him home if his dad wants that.  Then, we discussed the concern that Mr Dobek could die in son's car on the way home. We also discussed oxygen issues for a period of time.  It appears that the son has been calling [employer] and he has been mistakenly adding up numbers of  concentrators thinking that the amount of oxygen would double if one used more than one machine, etc etc.  I related what I have been consistently told by those in respiratory medicine, and that is that the max that can be given in the home setting is 15 LPM at 35%. And this is with two machines connected together.    We talked about trials on what he would be on during the transport home. Then I brought up the option of pt passing away here peacefully while the Hi Flow is removed.    The patient is inclined to want to die here now --rather than going home. He cites the 'stigma' of his wife having to live in their home after he is gone and having to know he died there. He doesn't want that for her.  Then, he also mentioned how happy he has been to have such a nice room with a view here at the hospital. We decided that by Tuesday evening, we will have a plan to have him either TRY to make it home OR a plan for a compassionate but terminal withdrawal from Hi Flow on Wednesday.    The other family members are not in on this plan yet and so we are not going to  talk about this in the room except among the closest family. The adult children are having to shield pt's wife from all the details as she is having some 'issues'. Family is keeping her on a routine day to day and so she will be here tomorrow around 11 am. We will include her in on the discussions about the plan for Wednesday.  I would like to try him on 6 lpm and see how long he can go on that tomorrow. I have been surprised before.  He is quite cyanotic however, on current hi flow settings.    I have updated nursing and care management.     IMPRESSON: Acute on chronic resp failure ---due to advanced emphysema, pulmonary htn, and pneumonia  COPD/ Severe bullous emphysema Pulmonary HTN due to COPD ---PA pressure 79 HTN Dyslipidemia AAA W/ hx of repair H/o repair of femoral artery aneurysm CAD Hx of Prostate Cancer   REVIEW OF SYSTEMS:   Pt says he is not having any pain.  He does feel short of breath 'all the time' but says he is so used to it that it isn't something he worries about.    CODE STATUS: DNR   PAST MEDICAL HISTORY: Past Medical History  Diagnosis Date  . Abdominal aneurysm ([place])   . 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 Cancer [facility])     prostate  . Hypertension   . Hyperlipidemia   . CAD (coronary artery disease)   . COPD (chronic obstructive pulmonary disease) ([place])   . AAA (abdominal aortic aneurysm) ([place])   . 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 Aortic atherosclerosis ([place])     PAST SURGICAL HISTORY:  Past Surgical History  Procedure Laterality Date  . Abdominal aortic aneurysm repair  2014  . Femoral artery aneurysm repair  2014  . Femoral artery aneurysm repair    . Abdominal aortic aneurysm repair      Vital Signs: BP 101/66 mmHg  Pulse 112  Temp(Src) 97.8 F (36.6 C) (Oral)  Resp 24  Ht _0  (1.778 m)  Wt 74.39 kg (164 lb)  BMI 23.53 kg/m2  SpO2 78% Filed Weights   01/29/16 0446 01/30/16 0540 01/31/16 0431  Weight: 74.39 kg (164 lb) 74.072 kg (163 lb 4.8 oz) 74.39 kg (164 lb)    Estimated body mass index is 23.53 kg/(m^2) as calculated from the following:   Height as of this encounter: _1  (1.778 m).   Weight as of this encounter: 74.39 kg (164 lb).  PHYSICAL EXAM: Lying in medical bed, on HI Flow at 42% and 35 LPM ---has been unable to get down lower than that amount He is alert, and generally oriented, but he gets somewhat confused at times --not much --but he recalls some things inaccurately His lips are quite cyanotic Neck w/o JVD or TM Hrt rrr tachy 105 Lungs decreased BS bases      LABS: CBC:    Component Value Date/Time   WBC 10.5 01/28/2016 0550   WBC 7.6 11/04/2015 1055   WBC 9.3 01/15/2015 0918   HGB 14.5 01/28/2016 0550   HGB 17.2 01/15/2015 0918   HCT 44.7 01/28/2016 0550   HCT 51.0 11/04/2015 1055   HCT 52.4* 01/15/2015 0918   PLT  139* 01/28/2016 0550   PLT 154 11/04/2015 1055   PLT 182 01/15/2015 0918   MCV 82.2 01/28/2016 0550   MCV 85 11/04/2015 1055   MCV 92 01/15/2015 0918   NEUTROABS 6.4  12/17/2015 0925   NEUTROABS 5.1 11/04/2015 1055   NEUTROABS 5.9 01/15/2015 0918   LYMPHSABS 1.0 12/17/2015 0925   LYMPHSABS 1.4 11/04/2015 1055   LYMPHSABS 2.1 01/15/2015 0918   MONOABS 1.0 12/17/2015 0925   MONOABS 0.9 01/15/2015 0918   EOSABS 0.1 12/17/2015 0925   EOSABS 0.2 11/04/2015 1055   EOSABS 0.3 01/15/2015 0918   BASOSABS 0.2* 12/17/2015 0925   BASOSABS 0.1 11/04/2015 1055   BASOSABS 0.1 01/15/2015 0918   Comprehensive Metabolic Panel:    Component Value Date/Time   NA 137 01/28/2016 0550   NA 143 11/04/2015 1055   NA 144 10/21/2014 0909   K 4.7 01/28/2016 0550   K 4.0 10/21/2014 0909   CL 105 01/28/2016 0550   CL 106 10/21/2014 0909   CO2 24 01/28/2016 0550   CO2 30 10/21/2014 0909   BUN 26* 01/28/2016 0550   BUN 21 11/04/2015 1055   BUN 15 10/21/2014 0909   CREATININE 0.95 01/30/2016 0442   CREATININE 1.37* 10/21/2014 0909   GLUCOSE 134* 01/28/2016 0550   GLUCOSE 90 11/04/2015 1055   GLUCOSE 71 10/21/2014 0909   CALCIUM 8.8* 01/28/2016 0550   CALCIUM 8.7 10/21/2014 0909   AST 31 01/09/2016 1453   AST 18 09/09/2013 0410   ALT 12* 01/03/2016 1453   ALT 28 09/09/2013 0410   ALKPHOS 72 01/26/2016 1453   ALKPHOS 51 09/09/2013 0410   BILITOT 2.4* 01/14/2016 1453   BILITOT 1.3* 11/04/2015 1055   BILITOT 0.9 09/09/2013 0410   PROT 7.7 01/07/2016 1453   PROT 7.2 11/04/2015 1055   PROT 5.2* 09/09/2013 0410   ALBUMIN 3.1* 01/27/2016 0446   ALBUMIN 4.3 11/04/2015 1055   ALBUMIN 2.5* 09/09/2013 0410    More than 50% of the visit was spent in counseling/coordination of care: YES  Time Spent:  65 min

## 2016-01-31 NOTE — Care Management (Addendum)
Family at the bedside. HFNC 42% 35 liters per respiratory therapy.  Charles Barton indicated that he was normally on 3-4 liters continuous at home. [name] RN MSN CCM Care Management [phone]

## 2016-01-31 NOTE — Progress Notes (Signed)
Moderate fall risk. Pt educated to call for assistance out of bed due to low oxygen saturation, refuses use of bed alarm for safety during the night. Will continue to monitor closely.

## 2016-01-31 NOTE — Care Management Important Message (Signed)
Important Message  Patient Details  Name: Charles Barton MRN: [id number] Date of Birth: 05/17/1941   Medicare Important Message Given:  Yes    [name], RN 01/31/2016, 8:36 AM

## 2016-01-31 NOTE — Progress Notes (Signed)
Unable to wean O2 this shift. Sats have remained mostly in the 70's on HFNC 42%, @ 35 lpm. Pt has been comfortable with no resp distress at rest.

## 2016-01-31 DEATH — deceased

## 2016-02-01 MED ORDER — METHYLPREDNISOLONE SODIUM SUCC 125 MG IJ SOLR
125.0000 mg | Freq: Once | INTRAMUSCULAR | Status: AC
Start: 2016-02-01 — End: 2016-02-01
  Administered 2016-02-01: 16:00:00 125 mg via INTRAVENOUS
  Filled 2016-02-01: qty 2

## 2016-02-01 MED ORDER — LORAZEPAM 2 MG/ML IJ SOLN: 1.0000 mg | INTRAMUSCULAR | Status: DC | PRN

## 2016-02-01 NOTE — Progress Notes (Signed)
Palliative Medicine Inpatient Consult Follow Up Note   Name: Charles Barton Date: 02/01/2016 MRN: [id number]  DOB: Sep 09, 1941  Referring Physician: Vaughan Basta, MD  Palliative Care consult requested for this 75 y.o. male for goals of medical therapy in patient with terminal stage of bullous emphysema unable to get off of Hi Flow.   DISCUSSIONS AND PLAN: I met with pt and son and another daughter and also pt's wife today at 11:30 am.   The purpose of this meeting was to come to a decision about the next steps to take.  After a very positive discussion between all parties present, the decision is firm now and pt will have a terminal withdrawal from the Hi Flow tomorrow at 1pm. Pt will have a slight increase in his oxygen delivery today from 42% to 45 % and I will increase his morphine slightly to 1.5 mg / hr. Pt himself denies symptoms, but we can SEE the cyanosis --and he is sitting up on the side of the bed.    His Co2 is not high b/c he actually does not get enough O2 into his blood stream to create more CO2. Just not enough exchange of O2 from his severe bullous emphysema.  He says he does not need a fan in the room.  He is pleased with his care and says he is kept comfortable by staff.    Family will consist of about 4-5 adult family members tomorrow. They are not going to have the extended family or grandchildren present.  I have DCd his pulse oximetry with his permission.  He has his own portable one he says he will use if he feels the need.   Will give one more dose of Solumedrol today but stop it after that.    He could pass away before the withdrawal from the Hi Flow. Also, he could surprise [place] and linger on.  But we are not going to allow him to suffer --even if he lingers. Family is reassured and in agreement with this plan.  Pt is ready and realizes his lungs will carry him to heaven --but not back to his earthly home.    I have updated the entire care team and  talked at some length with Resp and Nursing.    IMPRESSON: Acute on chronic resp failure ---due to advanced emphysema, pulmonary htn, and pneumonia  COPD/ Severe bullous emphysema Pulmonary HTN due to COPD ---PA pressure 79 HTN Dyslipidemia AAA W/ hx of repair H/o repair of femoral artery aneurysm CAD Hx of Prostate Cancer     CODE STATUS: DNR and DNI   PAST MEDICAL HISTORY: Past Medical History  Diagnosis Date  . Abdominal aneurysm ([place])   . 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 Cancer [facility])     prostate  . Hypertension   . Hyperlipidemia   . CAD (coronary artery disease)   . COPD (chronic obstructive pulmonary disease) ([place])   . AAA (abdominal aortic aneurysm) ([place])   . 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 Aortic atherosclerosis ([place])     PAST SURGICAL HISTORY:  Past Surgical History  Procedure Laterality Date  . Abdominal aortic aneurysm repair  2014  . Femoral artery aneurysm repair  2014  . Femoral artery aneurysm repair    . Abdominal aortic aneurysm repair      Vital Signs: BP 139/99 mmHg  Pulse 111  Temp(Src) 97.9 F (36.6 C) (Oral)  Resp 20  Ht _0  (1.778  m)  Wt 74.39 kg (164 lb)  BMI 23.53 kg/m2  SpO2 59% Filed Weights   01/29/16 0446 01/30/16 0540 01/31/16 0431  Weight: 74.39 kg (164 lb) 74.072 kg (163 lb 4.8 oz) 74.39 kg (164 lb)    Estimated body mass index is 23.53 kg/(m^2) as calculated from the following:   Height as of this encounter: _0  (1.778 m).   Weight as of this encounter: 74.39 kg (164 lb).  PHYSICAL EXAM: He is lavendar in color today --more so than yesterday  ---with perioral and extremity cyanosis that is readily obvious He is, despite sats in the mid 40's, still lucid and oriented and able to converse.  He is, however, sitting upright on the side of the bed --a sign that he senses his dyspnea to be worse than baseline (though he denies sensing it) EOMI OP clear Speech is coherent and rational No JVD or  TM Hrt rrr no m Lungs diminished throughout Abd soft and NT Ext cyanosis and distal mottling is even seen.   LABS: CBC:    Component Value Date/Time   WBC 10.5 01/28/2016 0550   WBC 7.6 11/04/2015 1055   WBC 9.3 01/15/2015 0918   HGB 14.5 01/28/2016 0550   HGB 17.2 01/15/2015 0918   HCT 44.7 01/28/2016 0550   HCT 51.0 11/04/2015 1055   HCT 52.4* 01/15/2015 0918   PLT 139* 01/28/2016 0550   PLT 154 11/04/2015 1055   PLT 182 01/15/2015 0918   MCV 82.2 01/28/2016 0550   MCV 85 11/04/2015 1055   MCV 92 01/15/2015 0918   NEUTROABS 6.4 12/17/2015 0925   NEUTROABS 5.1 11/04/2015 1055   NEUTROABS 5.9 01/15/2015 0918   LYMPHSABS 1.0 12/17/2015 0925   LYMPHSABS 1.4 11/04/2015 1055   LYMPHSABS 2.1 01/15/2015 0918   MONOABS 1.0 12/17/2015 0925   MONOABS 0.9 01/15/2015 0918   EOSABS 0.1 12/17/2015 0925   EOSABS 0.2 11/04/2015 1055   EOSABS 0.3 01/15/2015 0918   BASOSABS 0.2* 12/17/2015 0925   BASOSABS 0.1 11/04/2015 1055   BASOSABS 0.1 01/15/2015 0918   Comprehensive Metabolic Panel:    Component Value Date/Time   NA 137 01/28/2016 0550   NA 143 11/04/2015 1055   NA 144 10/21/2014 0909   K 4.7 01/28/2016 0550   K 4.0 10/21/2014 0909   CL 105 01/28/2016 0550   CL 106 10/21/2014 0909   CO2 24 01/28/2016 0550   CO2 30 10/21/2014 0909   BUN 26* 01/28/2016 0550   BUN 21 11/04/2015 1055   BUN 15 10/21/2014 0909   CREATININE 0.95 01/30/2016 0442   CREATININE 1.37* 10/21/2014 0909   GLUCOSE 134* 01/28/2016 0550   GLUCOSE 90 11/04/2015 1055   GLUCOSE 71 10/21/2014 0909   CALCIUM 8.8* 01/28/2016 0550   CALCIUM 8.7 10/21/2014 0909   AST 31 01/22/2016 1453   AST 18 09/09/2013 0410   ALT 12* 01/01/2016 1453   ALT 28 09/09/2013 0410   ALKPHOS 72 01/04/2016 1453   ALKPHOS 51 09/09/2013 0410   BILITOT 2.4* 01/03/2016 1453   BILITOT 1.3* 11/04/2015 1055   BILITOT 0.9 09/09/2013 0410   PROT 7.7 01/28/2016 1453   PROT 7.2 11/04/2015 1055   PROT 5.2* 09/09/2013 0410   ALBUMIN  3.1* 01/27/2016 0446   ALBUMIN 4.3 11/04/2015 1055   ALBUMIN 2.5* 09/09/2013 0410    More than 50% of the visit was spent in counseling/coordination of care: YES  Time Spent:  65 min

## 2016-02-01 NOTE — Progress Notes (Signed)
FIO2 increased to 45% per Dr. Megan Salon order.

## 2016-02-01 NOTE — Plan of Care (Signed)
Problem: Activity: Goal: Risk for activity intolerance will decrease Outcome: Not Progressing SOB up to [facility]. Oxygen saturations decreasing down to 48% at times.

## 2016-02-01 NOTE — Progress Notes (Signed)
[facility] [place] NAME: Charles Barton    MR#:  [id number]  DATE OF BIRTH:  [birth year]  SUBJECTIVE:  CHIEF COMPLAINT:   Chief Complaint  Patient presents with  . Shortness of Breath  . Chest Pain   - admitted to ICU for resp failure, on chronic 4l home o2 - off bipap, now on high flow nasal cannula- baseline sats around 75-85% inspite of o2 - remains on 50% FiO2. Frequent desaturations noted whenever he tries to talk. -Complains of left shoulder pain and worried about his pneumonia. - He told at home his O2 sats run in 71-75 and he is comfotable with that. - We set target SPO2 at 75 % and try to taper oxygen supply.    his oxygen level drops significantly with minimal exertion.  still Continue to require 30- 35 ltr oxygen supply.  have purplish fingers and gets desaturated easily with minimal exertion.  REVIEW OF SYSTEMS:  Review of Systems  Constitutional: Positive for malaise/fatigue. Negative for fever and chills.  HENT: Negative for ear discharge, ear pain and nosebleeds.   Eyes: Negative for blurred vision and double vision.  Respiratory: Positive for cough and shortness of breath. Negative for wheezing.   Cardiovascular: Negative for chest pain, palpitations and leg swelling.  Gastrointestinal: Negative for nausea, vomiting, abdominal pain, diarrhea and constipation.  Genitourinary: Negative for dysuria and urgency.  Musculoskeletal: Negative for myalgias.       Left shoulder pain  Neurological: Negative for dizziness, sensory change, speech change, focal weakness, seizures and headaches.  Psychiatric/Behavioral: Negative for depression.    DRUG ALLERGIES:   Allergies  Allergen Reactions  . No Known Allergies     VITALS:  Blood pressure 113/79, pulse 101, temperature 97.8 F (36.6 C), temperature source Oral, resp. rate 20, height _0  (1.778 m), weight 74.39 kg (164 lb), SpO2 78 %.  PHYSICAL EXAMINATION:   Physical Exam  GENERAL:  75 y.o.-year-old patient sitting in the bed with no acute distress. Appears dyspneic even on talking. EYES: Pupils equal, round, reactive to light and accommodation. No scleral icterus. Extraocular muscles intact.  HEENT: Head atraumatic, normocephalic. Oropharynx and nasopharynx clear.  NECK:  Supple, no jugular venous distention. No thyroid enlargement, no tenderness.  LUNGS: Moving air today, but Scattered expiratory wheezing noted. Using accessory muscles to breathe even with minimal exertion. On HFNC. -No rales, rhonchi or crackles heard. CARDIOVASCULAR: S1, S2 normal. Tachycardia, No  rubs, or gallops. 2/6 systolic murmur is present ABDOMEN: Soft, nontender, nondistended. Bowel sounds present. No organomegaly or mass.  EXTREMITIES: Slight pedal edema, no cyanosis, or clubbing. Distal pulses are present on both feet.  NEUROLOGIC: Cranial nerves II through XII are intact. Muscle strength 5/5 in all extremities. Sensation intact. Gait not checked.  PSYCHIATRIC: The patient is alert and oriented x 3.  SKIN: No obvious rash, lesion, or ulcer.    LABORATORY PANEL:   CBC  Recent Labs Lab 01/28/16 0550  WBC 10.5  HGB 14.5  HCT 44.7  PLT 139*   ------------------------------------------------------------------------------------------------------------------  Chemistries   Recent Labs Lab 01/28/16 0550 01/30/16 0442  NA 137  --   K 4.7  --   CL 105  --   CO2 24  --   GLUCOSE 134*  --   BUN 26*  --   CREATININE 0.83 0.95  CALCIUM 8.8*  --    ------------------------------------------------------------------------------------------------------------------  Cardiac Enzymes No results for input(s): TROPONINI in the last 168 hours. ------------------------------------------------------------------------------------------------------------------  RADIOLOGY:  No results found.  EKG:   Orders placed or performed during the hospital encounter of  01/26/2016  . EKG 12-Lead  . EKG 12-Lead  . EKG 12-Lead  . EKG 12-Lead    ASSESSMENT AND PLAN:   75 year old male with past medical history significant for chronic respiratory failure secondary to pulmonary hypertension and COPD on 4 L home oxygen, AAA, history of polycythemia, prostrate cancer and hypertension was brought into the hospital secondary to acute respiratory failure.  #1 acute on chronic respiratory failure-secondary to COPD exacerbation. Gradually improving. -Also left upper lobe infiltrate-community-acquired pneumonia. -Patient has been off of BiPAP. Transferred from ICU to the floor. -Remains on high flow nasal cannula  -Continue steroids, nebs and inhalers. -Baseline sats are around 82%. Goal is to keep sats at least about 75%. Advised to rest and talking less due to desaturations. -Palliative care consulted. Dr. Megan Salon has put him on a slow FiO2 weaning schedule. Please honour that. -Continue Rocephin and azithromycin. Treated for pneumonia for 7 days total. - increase roxanol dose slightly- patient and family agreeable - f/u CXR shows improvement. - He may need a higher concentration of oxygen at home, will try to arrange for that. Meanwhile in the hospital will continue trying to taper his oxygen requirement if he can come off from high flow nasal cannula oxygen then we can arrange for this discharge. He and his family had agreed on getting hospice services at home on discharge. - I encouraged him to use incentive spirometry. - Have his oxygen level is not getting better and he is continued to stay on very high requirement of oxygen supplementation, after having a long discussion with palliative care and all family members finally he came to conclusion off weaning him off terminally from oxygen in the hospital and scheduled for tomorrow.  #2 anxiety-on Ativan when necessary  #3 GERD-change Pepcid to oral  #4 DVT prophylaxis-on Lovenox  Ultimate goal is to discharge  home with either home health or hospice services once able to taper to nasal canula oxygen.  Unfortunately we are not able to taper down oxygen supply, he and his kids are having meeting with palliative care today for discussing further plan.    All the records are reviewed and case discussed with Care Management/Social Workerr. Management plans discussed with the patient, family and they are in agreement.  CODE STATUS: DNR  TOTAL TIME TAKING CARE OF THIS PATIENT: 35 minutes.    Terminal weaning him off from high flow nasal cannula oxygen tomorrow. POSSIBLE D/C IN 2-3 DAYS, DEPENDING ON CLINICAL CONDITION.   Vaughan Basta M.D on 02/01/2016 at 1:44 PM  Between 7am to 6pm - Pager - [phone]  After 6pm go to [email or web address] - password [email or web address] [facility]  Office  [phone]  CC: Primary care physician; Golden Pop, MD

## 2016-02-02 DIAGNOSIS — Z8679 Personal history of other diseases of the circulatory system: Secondary | ICD-10-CM

## 2016-02-02 DIAGNOSIS — J438 Other emphysema: Secondary | ICD-10-CM

## 2016-02-02 DIAGNOSIS — E785 Hyperlipidemia, unspecified: Secondary | ICD-10-CM

## 2016-02-02 DIAGNOSIS — Z8546 Personal history of malignant neoplasm of prostate: Secondary | ICD-10-CM

## 2016-02-02 MED ORDER — LORAZEPAM 2 MG/ML IJ SOLN
INTRAMUSCULAR | Status: AC
  Filled 2016-02-02: qty 1

## 2016-02-02 MED ORDER — LORAZEPAM 2 MG/ML IJ SOLN
2.0000 mg | Freq: Once | INTRAMUSCULAR | Status: AC
Start: 2016-02-02 — End: 2016-02-02
  Administered 2016-02-02: 2 mg via INTRAVENOUS

## 2016-03-02 NOTE — Progress Notes (Signed)
Pt released from [place] [facility] here to pick up patient-son at bedside, emotional support provided.

## 2016-03-02 NOTE — Progress Notes (Signed)
Palliative Medicine Inpatient Consult Follow Up Note   Name: Charles Barton Date: 2016-02-03 MRN: [id number]  DOB: 1940/12/29  Referring Physician: Vaughan Basta, MD  Palliative Care consult requested for this 75 y.o. male for goals of medical therapy in patient with end stage bullous emphysema.  PLAN: Pt is not able to be reduced down to a high flow level of O2 that can be provided outside of this hospital.  Pt made the choice to be taken off of this form of Life Support (it has become that for him) and he understood that we would put him on morphine to treat air hunger.  Pt and family understand that the dose of morphine is intended for gradual sedative effect and also to prevent suffering and panic from a sense of smothering that might occur as the high flow oxygen is removed and remains off.  Pt had high flow removed at around 1:30 pm while he was comfortable and sedated on relatively low morphine drip dose.  He required some increases as he had become somewhat tolerant on the days of routine Roxanol etc.  He has had a decrease in his respirations --and remains alive on 1 LPM O2. We are not checking his sats as we know them to be low as they were extrememly low on the high flow settings.    He appears comfortable. Chaplain is present.  Family is talking about their memories and prayers are being said.  I have been in the room much of this time, providing verbal orders to nursing and supportive conversation to the family.  At this time, patient is still alive, but he is showing [place] every sign that he will die soon.    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 Abdominal aneurysm ([place])   . 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 Cancer [facility])     prostate  . Hypertension   . Hyperlipidemia   . CAD (coronary artery disease)   . COPD (chronic obstructive pulmonary disease) ([place])   . AAA (abdominal aortic aneurysm) ([place])   . 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 Aortic atherosclerosis ([place])     PAST SURGICAL HISTORY:  Past Surgical History  Procedure Laterality Date  . Abdominal aortic aneurysm repair  2014  . Femoral artery aneurysm repair  2014  . Femoral artery aneurysm repair    . Abdominal aortic aneurysm repair      Vital Signs: BP 116/93 mmHg  Pulse 114  Temp(Src) 98 F (36.7 C) (Oral)  Resp 15  Ht _0  (1.778 m)  Wt 74.39 kg (164 lb)  BMI 23.53 kg/m2  SpO2 68% Filed Weights   01/29/16 0446 01/30/16 0540 01/31/16 0431  Weight: 74.39 kg (164 lb) 74.072 kg (163 lb 4.8 oz) 74.39 kg (164 lb)    Estimated body mass index is 23.53 kg/(m^2) as calculated from the following:   Height as of this encounter: _1  (1.778 m).   Weight as of this encounter: 74.39 kg (164 lb).  PHYSICAL EXAM: Alert but confused a bit Talking but not with a lot of clarity EOMI OP clear Neck no JVD or TM  Hrt rrr tachy Lungs with distant BS Abd soft and NT Ext cyanosis is noted on distal extremities and perioral area Less blue than yesterday (on slightly increased oxygen % this am).  LABS: CBC:    Component Value Date/Time   WBC 10.5 01/28/2016 0550   WBC 7.6 11/04/2015 1055   WBC 9.3 01/15/2015 0918   HGB 14.5 01/28/2016 0550   HGB 17.2 01/15/2015 0918   HCT 44.7 01/28/2016 0550   HCT 51.0 11/04/2015 1055   HCT 52.4* 01/15/2015 0918   PLT 139* 01/28/2016 0550   PLT 154 11/04/2015 1055   PLT 182 01/15/2015 0918   MCV 82.2 01/28/2016 0550   MCV 85 11/04/2015 1055   MCV 92 01/15/2015 0918   NEUTROABS 6.4 12/17/2015 0925   NEUTROABS 5.1 11/04/2015 1055   NEUTROABS 5.9 01/15/2015 0918   LYMPHSABS 1.0 12/17/2015 0925    LYMPHSABS 1.4 11/04/2015 1055   LYMPHSABS 2.1 01/15/2015 0918   MONOABS 1.0 12/17/2015 0925   MONOABS 0.9 01/15/2015 0918   EOSABS 0.1 12/17/2015 0925   EOSABS 0.2 11/04/2015 1055   EOSABS 0.3 01/15/2015 0918   BASOSABS 0.2* 12/17/2015 0925   BASOSABS 0.1 11/04/2015 1055   BASOSABS 0.1 01/15/2015 0918   Comprehensive Metabolic Panel:    Component Value Date/Time   NA 137 01/28/2016 0550   NA 143 11/04/2015 1055   NA 144 10/21/2014 0909   K 4.7 01/28/2016 0550   K 4.0 10/21/2014 0909   CL 105 01/28/2016 0550   CL 106 10/21/2014 0909   CO2 24 01/28/2016 0550   CO2 30 10/21/2014 0909   BUN 26* 01/28/2016 0550   BUN 21 11/04/2015 1055   BUN 15 10/21/2014 0909   CREATININE 0.95 01/30/2016 0442   CREATININE 1.37* 10/21/2014 0909   GLUCOSE 134* 01/28/2016 0550   GLUCOSE 90 11/04/2015 1055   GLUCOSE 71 10/21/2014 0909   CALCIUM 8.8* 01/28/2016 0550   CALCIUM 8.7 10/21/2014 0909   AST 31 01/24/2016 1453   AST 18 09/09/2013 0410   ALT 12* 01/14/2016 1453   ALT 28 09/09/2013 0410   ALKPHOS 72 01/27/2016 1453   ALKPHOS 51 09/09/2013 0410   BILITOT 2.4* 01/22/2016 1453   BILITOT 1.3* 11/04/2015 1055   BILITOT 0.9 09/09/2013 0410   PROT 7.7 01/02/2016 1453   PROT 7.2 11/04/2015 1055   PROT 5.2* 09/09/2013 0410   ALBUMIN 3.1* 01/27/2016 0446   ALBUMIN 4.3 11/04/2015 1055   ALBUMIN 2.5* 09/09/2013 0410     More than 50% of the visit was spent in counseling/coordination of care: YES  Time Spent: 120 min

## 2016-03-02 NOTE — Care Management Important Message (Signed)
Important Message  Patient Details  Name: Charles Barton MRN: [id number] Date of Birth: 1941/07/15   Medicare Important Message Given:  Yes    Juliann Pulse A Kendelle Schweers 02-04-16, 10:39 AM

## 2016-03-02 NOTE — Progress Notes (Signed)
Charles Barton at [facility] NAME: Charles Barton    MR#:  [id number]  DATE OF BIRTH:  17-Oct-1940  SUBJECTIVE:  CHIEF COMPLAINT:   Chief Complaint  Patient presents with  . Shortness of Breath  . Chest Pain   - admitted to ICU for resp failure, on chronic 4l home o2 - off bipap, now on high flow nasal cannula- baseline sats around 75-85% inspite of o2 - remains on 50% FiO2. Frequent desaturations noted whenever he tries to talk. -Complains of left shoulder pain and worried about his pneumonia. - He told at home his O2 sats run in 71-75 and he is comfotable with that. - We set target SPO2 at 75 % and try to taper oxygen supply.    his oxygen level drops significantly with minimal exertion.  still Continue to require 30- 35 ltr oxygen supply.  have purplish fingers and lips and gets desaturated easily with minimal exertion.  REVIEW OF SYSTEMS:  Review of Systems  Constitutional: Positive for malaise/fatigue. Negative for fever and chills.  HENT: Negative for ear discharge, ear pain and nosebleeds.   Eyes: Negative for blurred vision and double vision.  Respiratory: Positive for cough and shortness of breath. Negative for wheezing.   Cardiovascular: Negative for chest pain, palpitations and leg swelling.  Gastrointestinal: Negative for nausea, vomiting, abdominal pain, diarrhea and constipation.  Genitourinary: Negative for dysuria and urgency.  Musculoskeletal: Negative for myalgias.       Left shoulder pain  Neurological: Negative for dizziness, sensory change, speech change, focal weakness, seizures and headaches.  Psychiatric/Behavioral: Negative for depression.    DRUG ALLERGIES:   Allergies  Allergen Reactions  . No Known Allergies     VITALS:  Blood pressure 116/93, pulse 114, temperature 98 F (36.7 C), temperature source Oral, resp. rate 15, height _0  (1.778 m), weight 74.39 kg (164 lb), SpO2 68 %.  PHYSICAL  EXAMINATION:  Physical Exam  GENERAL:  75 y.o.-year-old patient sitting in the bed with no acute distress. Appears dyspneic even on talking. EYES: Pupils equal, round, reactive to light and accommodation. No scleral icterus. Extraocular muscles intact.  HEENT: Head atraumatic, normocephalic. Oropharynx and nasopharynx clear.  NECK:  Supple, no jugular venous distention. No thyroid enlargement, no tenderness.  LUNGS: Moving air today, but Scattered expiratory wheezing noted. Using accessory muscles to breathe even with minimal exertion. On HFNC. -No rales, rhonchi or crackles heard. CARDIOVASCULAR: S1, S2 normal. Tachycardia, No  rubs, or gallops. 2/6 systolic murmur is present ABDOMEN: Soft, nontender, nondistended. Bowel sounds present. No organomegaly or mass.  EXTREMITIES: Slight pedal edema, no cyanosis, or clubbing. Distal pulses are present on both feet.  NEUROLOGIC: Cranial nerves II through XII are intact. Muscle strength 5/5 in all extremities. Sensation intact. Gait not checked.  PSYCHIATRIC: The patient is alert and oriented x 3.  SKIN: No obvious rash, lesion, or ulcer. Purplish lips and fingers.   LABORATORY PANEL:   CBC  Recent Labs Lab 01/28/16 0550  WBC 10.5  HGB 14.5  HCT 44.7  PLT 139*   ------------------------------------------------------------------------------------------------------------------  Chemistries   Recent Labs Lab 01/28/16 0550 01/30/16 0442  NA 137  --   K 4.7  --   CL 105  --   CO2 24  --   GLUCOSE 134*  --   BUN 26*  --   CREATININE 0.83 0.95  CALCIUM 8.8*  --    ------------------------------------------------------------------------------------------------------------------  Cardiac Enzymes No results for input(s): TROPONINI  in the last 168 hours. ------------------------------------------------------------------------------------------------------------------  RADIOLOGY:  No results found.  EKG:   Orders placed or  performed during the hospital encounter of 01/01/2016  . EKG 12-Lead  . EKG 12-Lead  . EKG 12-Lead  . EKG 12-Lead    ASSESSMENT AND PLAN:   75 year old male with past medical history significant for chronic respiratory failure secondary to pulmonary hypertension and COPD on 4 L home oxygen, AAA, history of polycythemia, prostrate cancer and hypertension was brought into the hospital secondary to acute respiratory failure.  #1 acute on chronic respiratory failure-secondary to COPD exacerbation. Not much improvement over last few days. -Also left upper lobe infiltrate-community-acquired pneumonia. -Patient has been off of BiPAP. Transferred from ICU to the floor. -Remains on high flow nasal cannula  -Continue steroids, nebs and inhalers. -Baseline sats are around 82%. Goal is to keep sats at least about 75%. Advised to rest and talking less due to desaturations. -Palliative care consulted. Dr. Megan Salon has put him on a slow FiO2 weaning schedule. Please honour that. -Continue Rocephin and azithromycin. Treated for pneumonia for 7 days total. - increase roxanol dose slightly- patient and family agreeable - f/u CXR shows improvement. - He may need a higher concentration of oxygen at home, will try to arrange for that. Meanwhile in the hospital will continue trying to taper his oxygen requirement if he can come off from high flow nasal cannula oxygen then we can arrange for this discharge. He and his family had agreed on getting hospice services at home on discharge. - I encouraged him to use incentive spirometry. - Have his oxygen level is not getting better and he is continued to stay on very high requirement of oxygen supplementation, after having a long discussion with palliative care and all family members finally he came to conclusion off weaning him off terminally from oxygen in the hospital and scheduled for Wednesday, today.  #2 anxiety-on Ativan when necessary   morphin.  #3  GERD-change Pepcid to oral  #4 DVT prophylaxis-on Lovenox  Ultimate goal is to discharge home with either home health or hospice services once able to taper to nasal canula oxygen.  Unfortunately we are not able to taper down oxygen supply, Plan is for Terminal weaning from HFNC today in presence of all family members.   All the records are reviewed and case discussed with Care Management/Social Workerr. Management plans discussed with the patient, family and they are in agreement.  CODE STATUS: DNR  TOTAL TIME TAKING CARE OF THIS PATIENT: 25 minutes.    Terminal weaning him off from high flow nasal cannula oxygen tomorrow. POSSIBLE D/C IN 2-3 DAYS, DEPENDING ON CLINICAL CONDITION.   Vaughan Basta M.D on 02/16/16 at 10:48 AM  Between 7am to 6pm - Pager - [phone]  After 6pm go to [email or web address] - password [email or web address] [facility]  Office  [phone]  CC: Primary care physician; Golden Pop, MD

## 2016-03-02 NOTE — Progress Notes (Signed)
Nursing Supervisor and Dr Anselm Jungling notified of patient expiring

## 2016-03-02 NOTE — Progress Notes (Signed)
[place] notified-on hold for tissue-waiting for return phone call.

## 2016-03-02 NOTE — Discharge Summary (Signed)
Cause of Death- COPD.  Hospital course.    75 year old male with past medical history significant for chronic respiratory failure secondary to pulmonary hypertension and COPD on 4 L home oxygen, AAA, history of polycythemia, prostrate cancer and hypertension was brought into the hospital secondary to acute respiratory failure.  #1 acute on chronic respiratory failure-secondary to COPD exacerbation. Not much improvement over last few days. -Also left upper lobe infiltrate-community-acquired pneumonia. -Patient has been off of BiPAP. Transferred from ICU to the floor. -Remains on high flow nasal cannula  -Continue steroids, nebs and inhalers. -Baseline sats are around 82%. Goal is to keep sats at least about 75%. Advised to rest and talking less due to desaturations. -Palliative care consulted. Dr. Megan Salon has put him on a slow FiO2 weaning schedule. Please honour that. -Continue Rocephin and azithromycin. Treated for pneumonia for 7 days total. - increase roxanol dose slightly- patient and family agreeable - f/u CXR shows improvement. - He may need a higher concentration of oxygen at home, will try to arrange for that. Meanwhile in the hospital will continue trying to taper his oxygen requirement if he can come off from high flow nasal cannula oxygen then we can arrange for this discharge. He and his family had agreed on getting hospice services at home on discharge. - I encouraged him to use incentive spirometry. - Have his oxygen level is not getting better and he is continued to stay on very high requirement of oxygen supplementation, after having a long discussion with palliative care and all family members finally he came to conclusion off weaning him off terminally from oxygen in the hospital and he died in few hours of tapering from HFNC  #2 anxiety-on Ativan when necessary  morphin.  #3 GERD-change Pepcid to oral  #4 DVT prophylaxis-on Lovenox  Ultimate goal was to discharge home  with either home health or hospice services once able to taper to nasal canula oxygen. Unfortunately we are not able to taper down oxygen supply, Finally family and pt agreed on terminal tapering off HFNC, and he died in few hours of doing that.

## 2016-03-02 NOTE — Progress Notes (Signed)
Death Pronouncement  Pt was taken off of Hi Flow O2 around 1:30 today. It was known this would be a terminal event, but we were careful to titrate morphine for air hunger very gradually so that his demise would be due to his terminal lung disease and not due to medication.  He had cyanosis very quckly but as he was very used to living with extremely low O2 saturations, his breathing lasted for about 3 hours.    Family was bedside most of this time. I was bedside providing supportive conversation during much of this time.    At 4:22 pm on this date, pt was noted to have no respirations and no heart beat. He had no pulses and no breath or heart sounds. Pupils were fixed and dilated.    He is pronounced dead at 2023-02-02 pm.   Kirby Funk, MD

## 2016-03-02 NOTE — Progress Notes (Addendum)
Pt without respirations or heartbeat-Dr Megan Salon notified-Emotional support provided for family during the 3 hour transition.

## 2016-03-02 DEATH — deceased

## 2016-03-22 ENCOUNTER — Other Ambulatory Visit: Payer: Self-pay | Admitting: Nurse Practitioner

## 2016-05-03 ENCOUNTER — Ambulatory Visit: Payer: Self-pay | Admitting: Family Medicine

## 2016-10-28 IMAGING — CR DG CHEST 2V
1 series · 2 of 2 positions shown · non-contrast
Comparison: PA and lateral chest of May 13, 2014

CLINICAL DATA: Two weeks of progressive cough, congestion for the
past 4 days, history of oxygen dependent COPD. ; history of past
tobacco use

EXAM:
CHEST  2 VIEW

[Series 1: kdxr chest pa (or ap) and lat · 0.14mm/px · 2 of 2 slices shown]
[im 1/2]
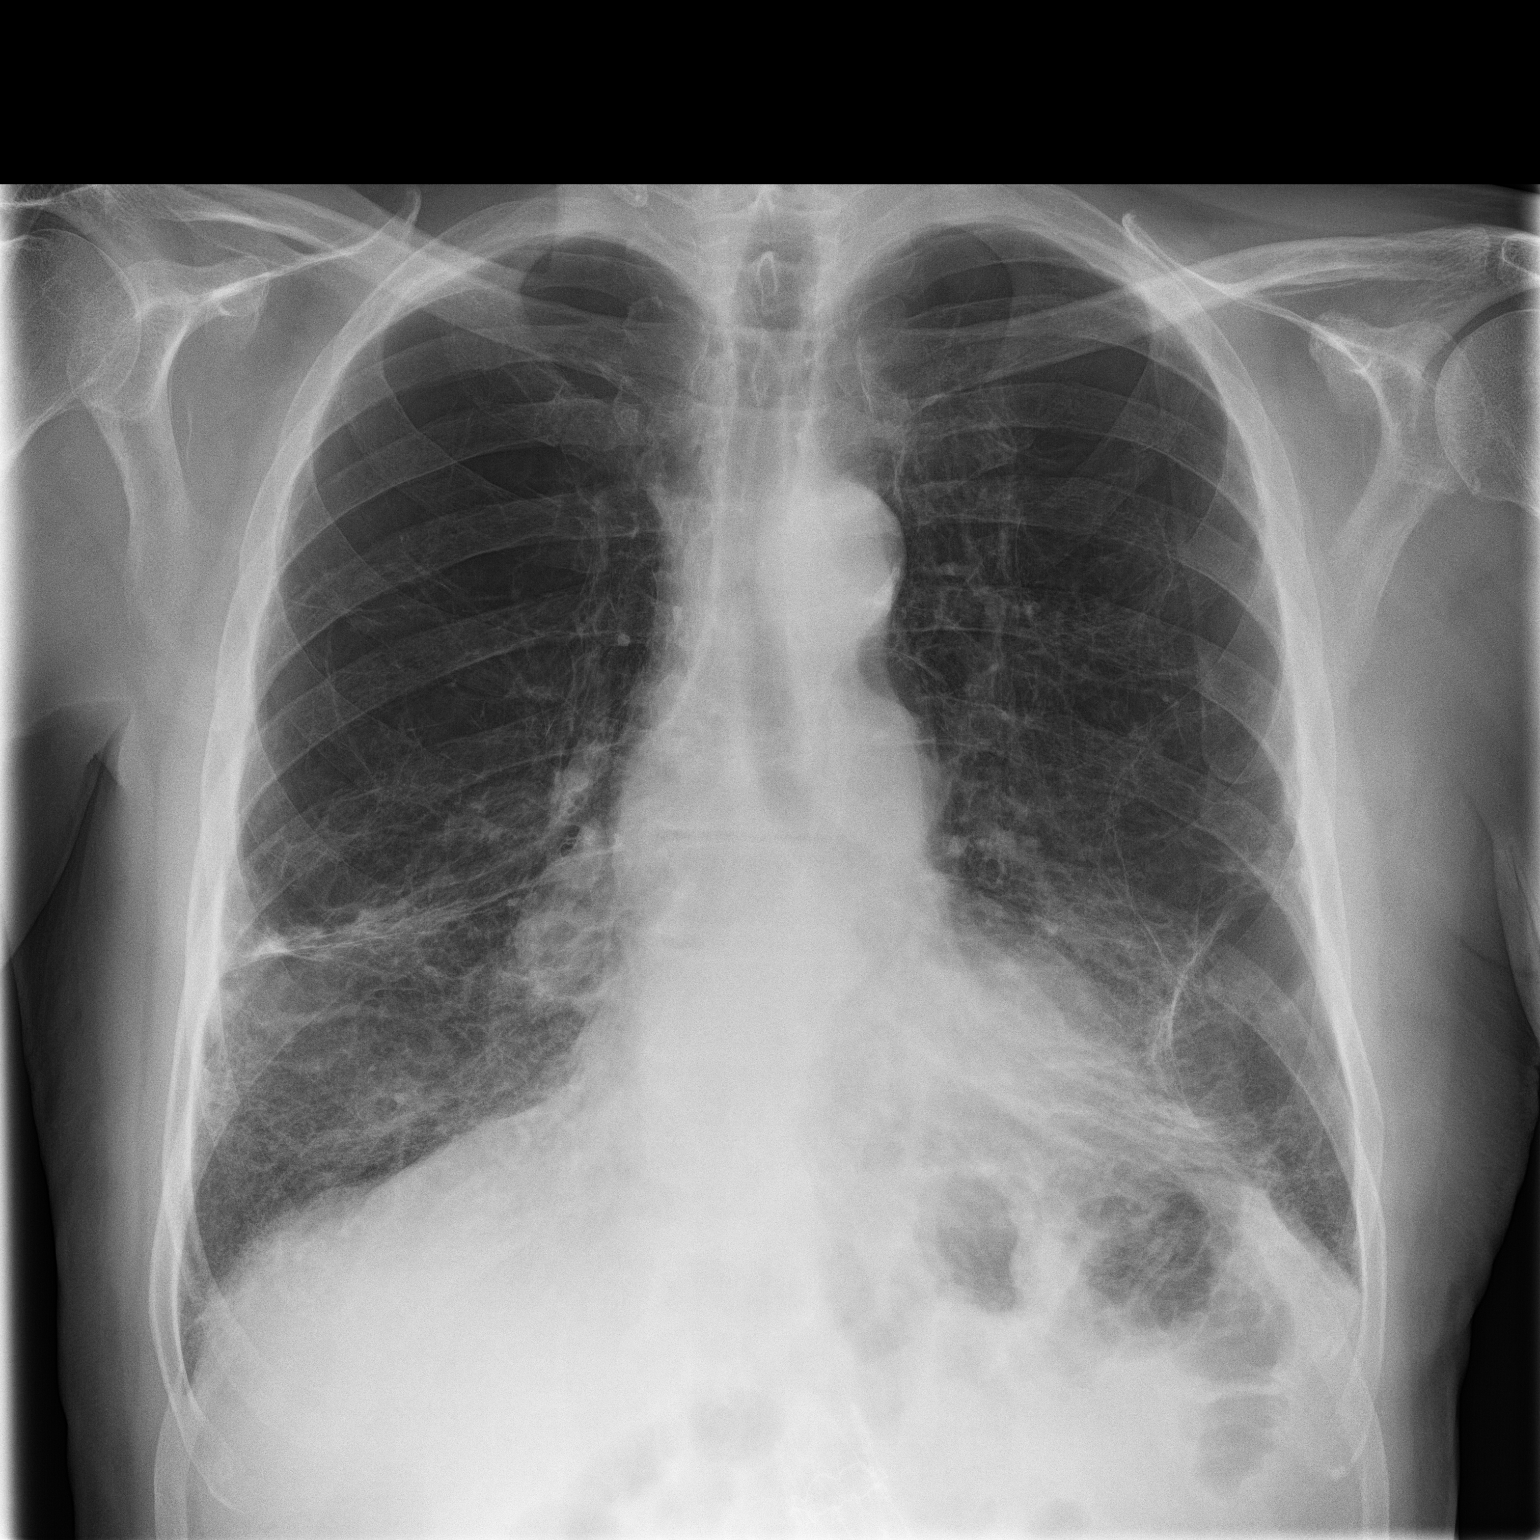
[im 2/2]
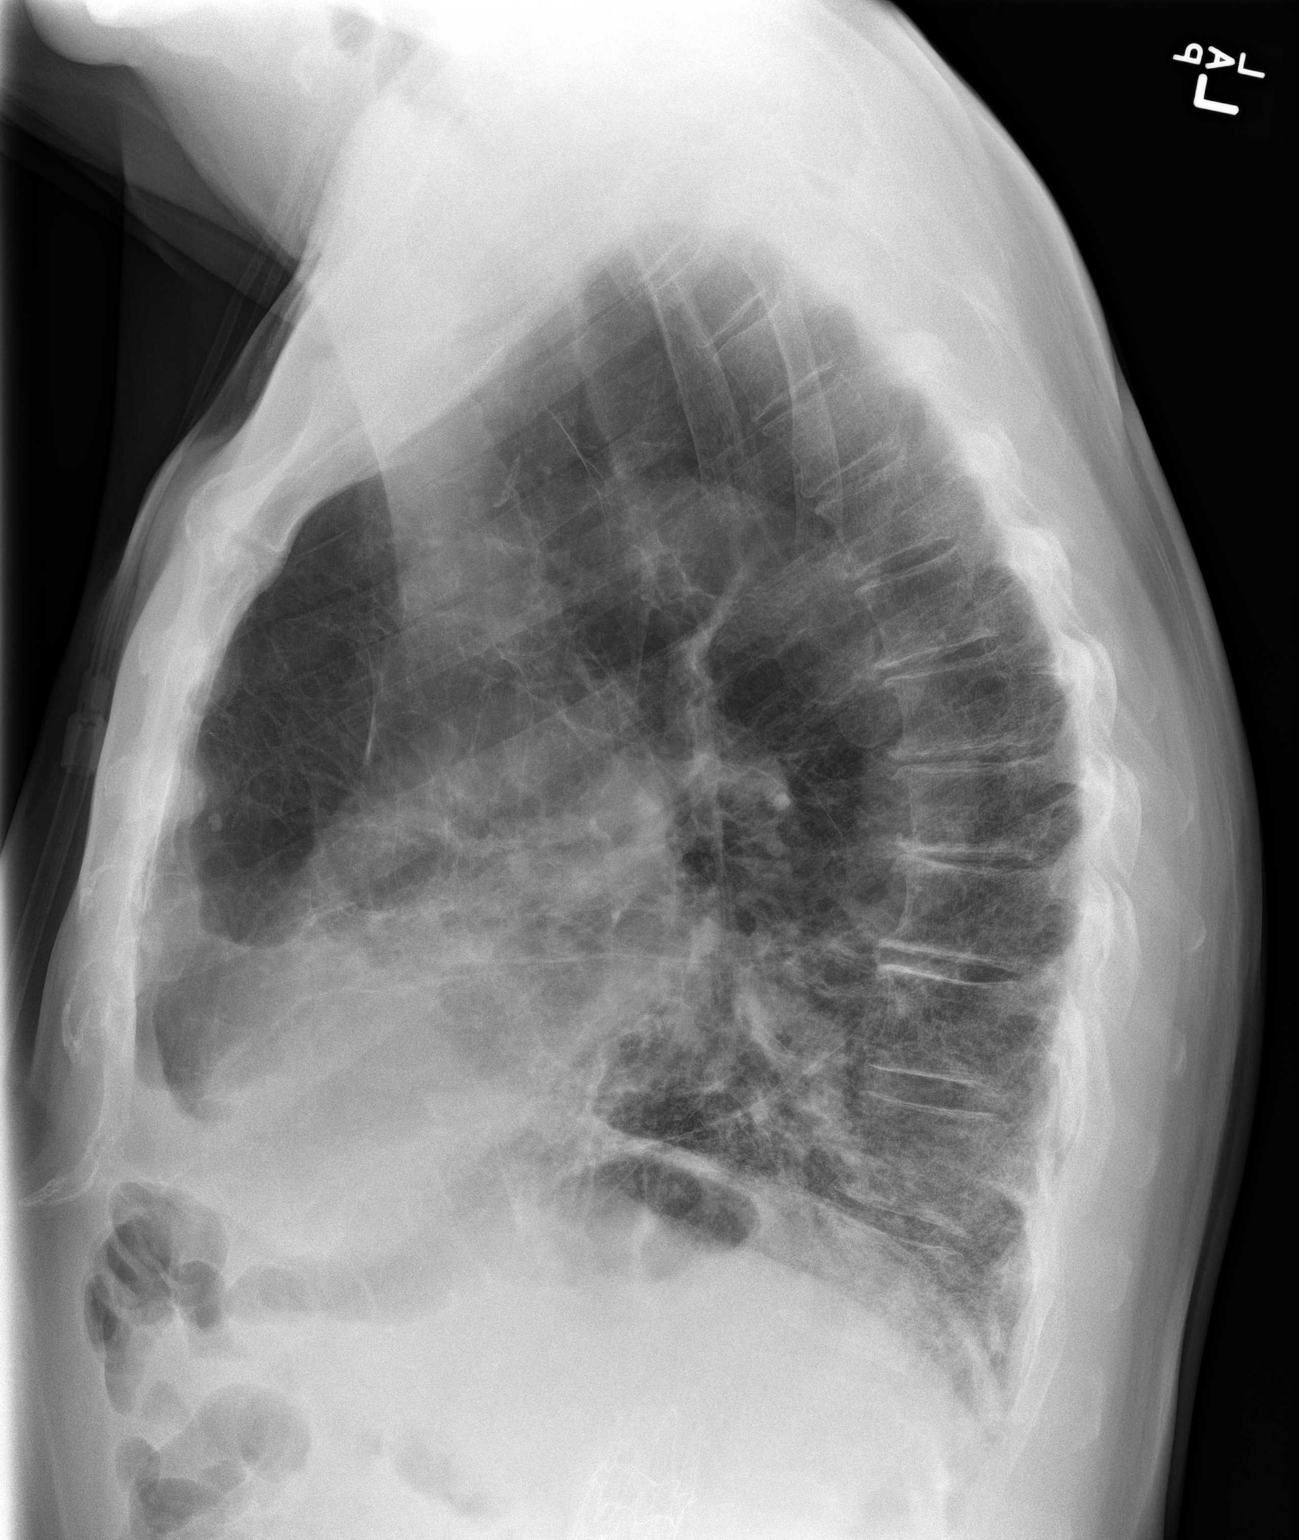

[2 of 2 positions shown; findings below may reference images not displayed]

FINDINGS: The lungs remain hyperinflated with bullous changes in the upper
lobes. There is stable bibasilar scarring. There is no acute
infiltrate. There is no pleural effusion or pneumothorax. The heart
and pulmonary vascularity are unremarkable. The mediastinum is
normal in width. There is tortuosity of the descending thoracic
aorta. The bony thorax exhibits no acute abnormality.
IMPRESSION: Bullous emphysematous change with bibasilar scarring. No focal
pneumonia is demonstrated.

## 2018-03-08 IMAGING — CT CT CHEST W/O CM
2 of 3 series · 17 of 46 positions shown, 19 images · non-contrast
Comparison: Chest radiograph performed earlier today at [DATE] a.m.

CLINICAL DATA: Acute onset of respiratory failure. Personal history
of COPD. Initial encounter.

EXAM:
CT CHEST WITHOUT CONTRAST
TECHNIQUE: Multidetector CT imaging of the chest was performed following the
standard protocol without IV contrast.

[Series 2: routine chest wo · axial · 0.75mm/px · z∈[-362,-72]mm · 14 of 68 slices shown, 16 images]
[im 5/68  soft-tissue]
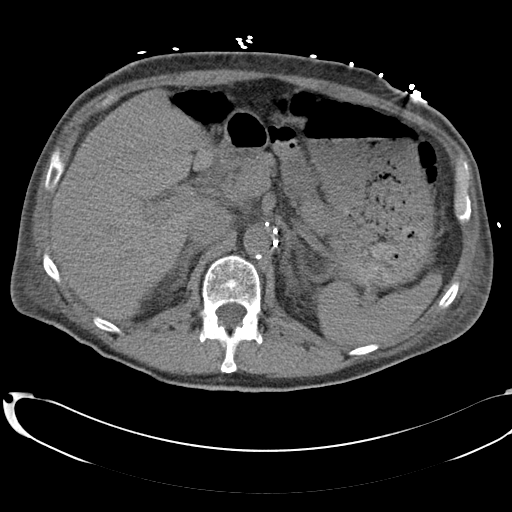
[im 5/68  bone]
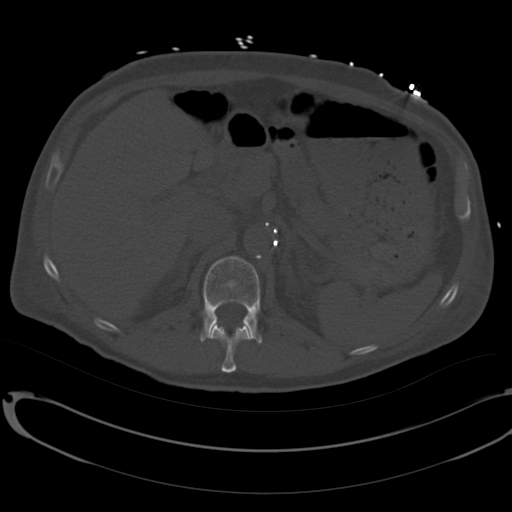
[im 9/68  soft-tissue]
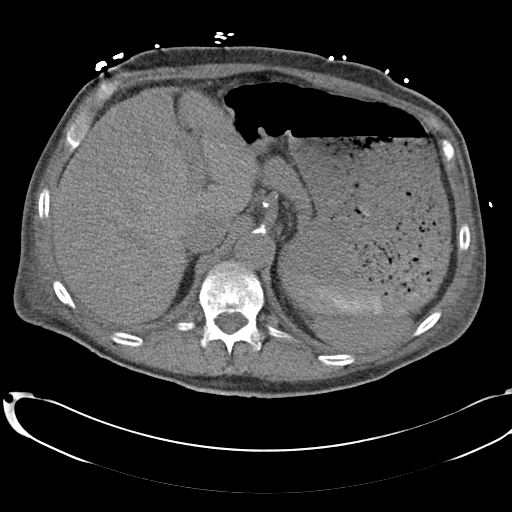
[im 13/68  soft-tissue]
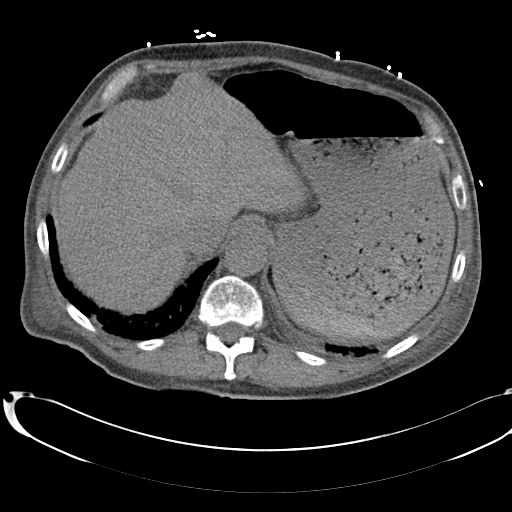
[im 18/68  soft-tissue]
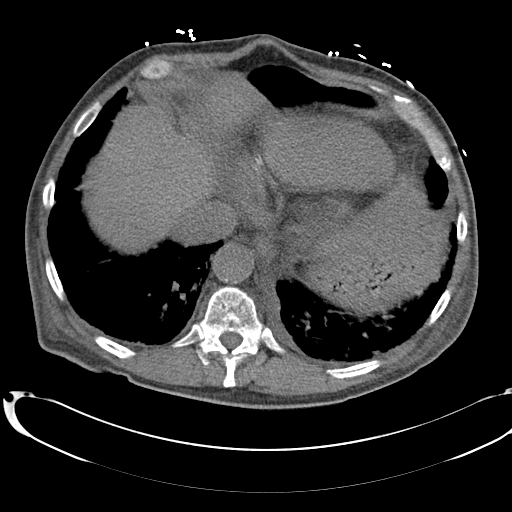
[im 22/68  soft-tissue]
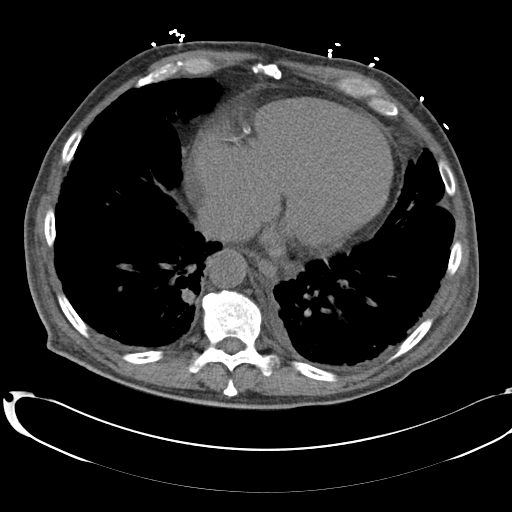
[im 26/68  soft-tissue]
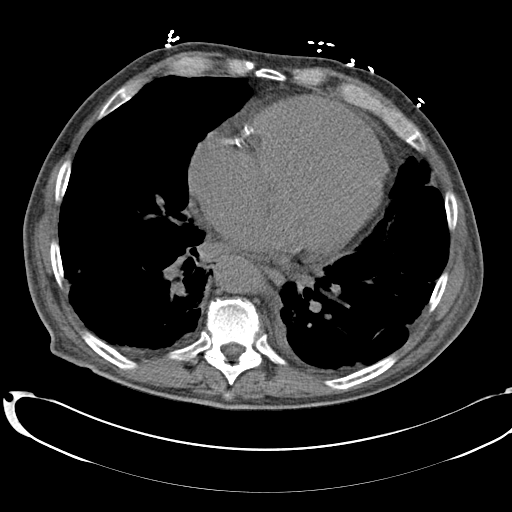
[im 31/68  soft-tissue]
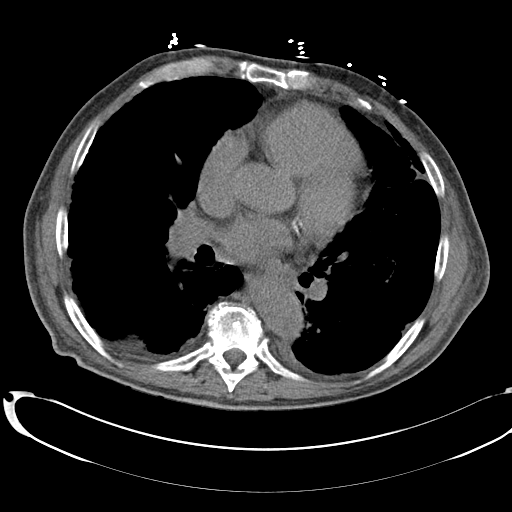
[im 37/68  soft-tissue]
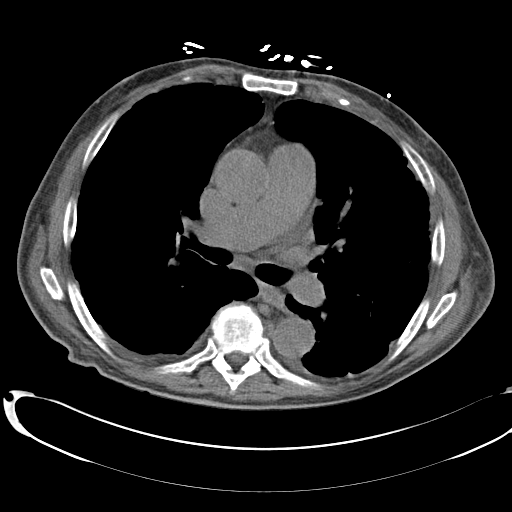
[im 42/68  soft-tissue]
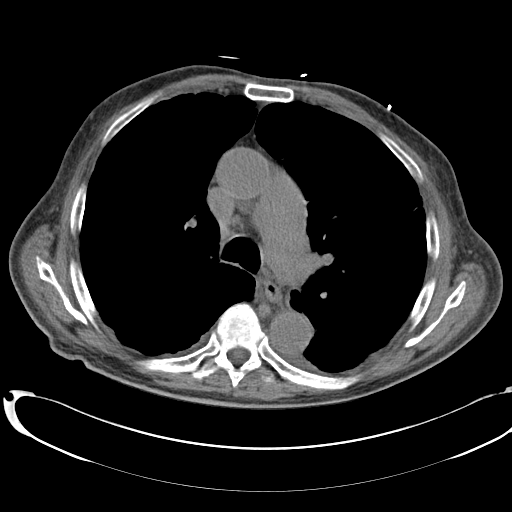
[im 42/68  bone]
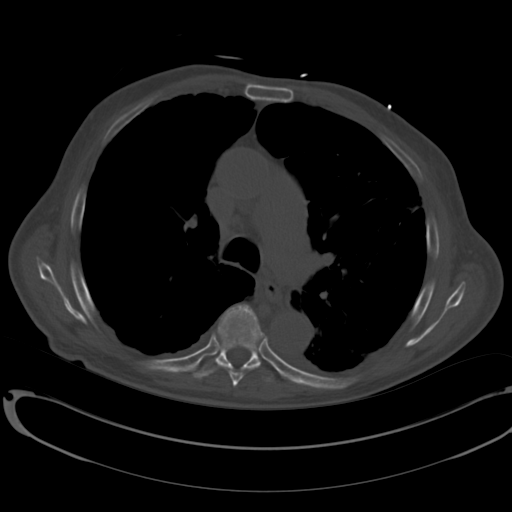
[im 46/68  soft-tissue]
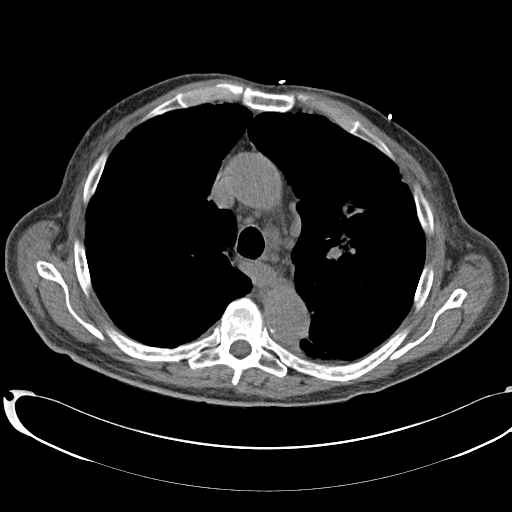
[im 50/68  soft-tissue]
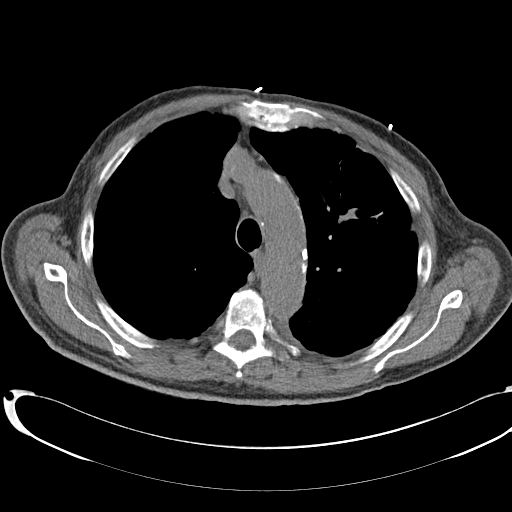
[im 55/68  soft-tissue]
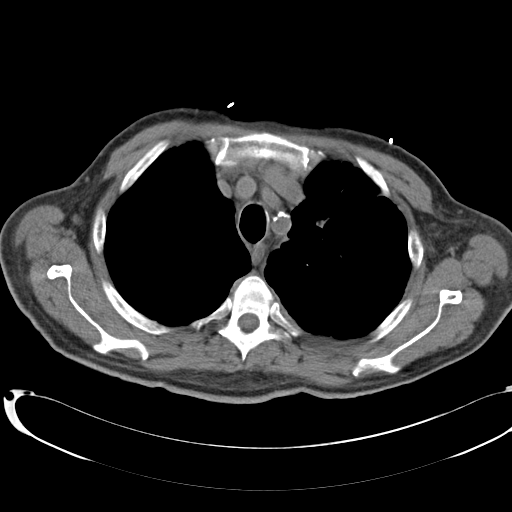
[im 59/68  soft-tissue]
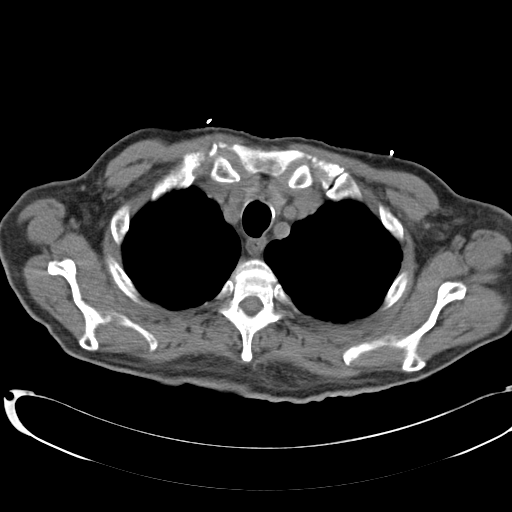
[im 63/68  soft-tissue]
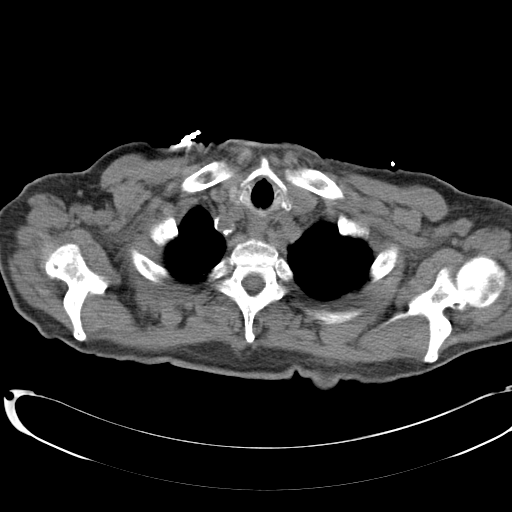

[Series 5: routine chest wo cor · coronal · 0.69mm/px · 3 of 128 slices shown]
[im 43/128  soft-tissue]
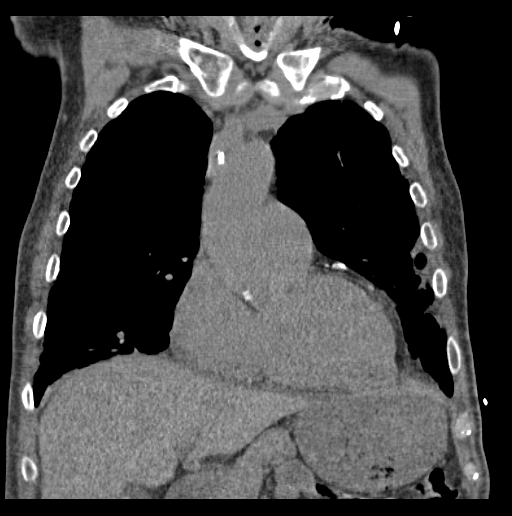
[im 57/128  soft-tissue]
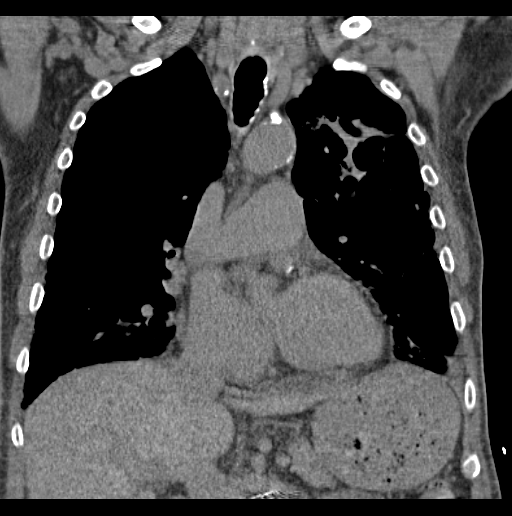
[im 71/128  soft-tissue]
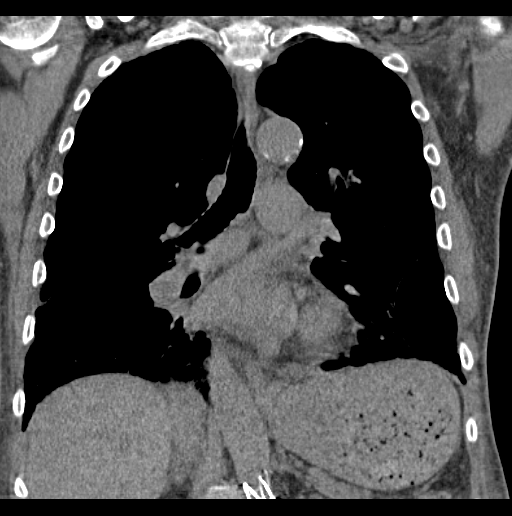

[17 of 46 positions shown; findings below may reference images not displayed]

FINDINGS: Significant bilateral emphysematous change is noted, more prominent
at the upper lung zones, with scattered scarring and minimal
airspace opacity noted at the lower lung zones. Mild superimposed
infection cannot be excluded. There may also be focal airspace
opacity at the left lung apex. No pleural effusion or pneumothorax
is seen.

Diffuse coronary artery calcifications are seen. The mediastinum is
grossly unremarkable. No mediastinal lymphadenopathy is seen. No
pericardial effusion is identified. The great vessels are grossly
unremarkable. The thyroid gland is unremarkable. No axillary
lymphadenopathy is seen.

The visualized portions of the liver and spleen are unremarkable. An
abdominal aortic stent is noted. Stones are noted within the
gallbladder. The visualized portions of the pancreas and adrenal
glands are within normal limits.

No acute osseous abnormalities are identified.
IMPRESSION: 1. Minimal airspace opacity in the lower lung zones, and question of
focal airspace opacity at the left lung apex, which may reflect mild
superimposed infection.
2. Underlying significant bilateral emphysematous change, more
prominent at the upper lung zones, with scarring at the lower lung
zones.
3. Diffuse coronary artery calcifications seen.
4. Cholelithiasis.
